# Patient Record
Sex: Female | Born: 1960 | Race: White | Hispanic: No | Marital: Single | Smoking: Former smoker
Health system: Southern US, Community
[De-identification: ages and names within clinical notes are randomized; demographics above are authoritative.]

## PROBLEM LIST (undated history)

## (undated) DIAGNOSIS — M810 Age-related osteoporosis without current pathological fracture: Secondary | ICD-10-CM

## (undated) DIAGNOSIS — E039 Hypothyroidism, unspecified: Secondary | ICD-10-CM

## (undated) DIAGNOSIS — K219 Gastro-esophageal reflux disease without esophagitis: Secondary | ICD-10-CM

## (undated) DIAGNOSIS — M1990 Unspecified osteoarthritis, unspecified site: Secondary | ICD-10-CM

## (undated) DIAGNOSIS — A1801 Tuberculosis of spine: Secondary | ICD-10-CM

## (undated) DIAGNOSIS — N183 Chronic kidney disease, stage 3 unspecified: Secondary | ICD-10-CM

## (undated) DIAGNOSIS — E271 Primary adrenocortical insufficiency: Secondary | ICD-10-CM

## (undated) HISTORY — DX: Tuberculosis of spine: A18.01

## (undated) HISTORY — DX: Unspecified osteoarthritis, unspecified site: M19.90

## (undated) HISTORY — DX: Chronic kidney disease, stage 3 unspecified: N18.30

## (undated) HISTORY — DX: Age-related osteoporosis without current pathological fracture: M81.0

## (undated) HISTORY — DX: Gastro-esophageal reflux disease without esophagitis: K21.9

## (undated) HISTORY — DX: Primary adrenocortical insufficiency: E27.1

## (undated) HISTORY — DX: Hypothyroidism, unspecified: E03.9

## (undated) HISTORY — PX: COLONOSCOPY, ESOPHAGOGASTRODUODENOSCOPY (EGD) AND ESOPHAGEAL DILATION: SHX5781

---
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
## 1987-11-27 HISTORY — PX: TUBAL LIGATION: SHX77

## 1991-11-27 HISTORY — PX: ABDOMINAL HYSTERECTOMY: SHX81

## 1991-11-27 HISTORY — PX: PAROTIDECTOMY: SHX2163

## 1998-11-26 HISTORY — PX: TUMOR EXCISION: SHX421

## 2000-11-26 HISTORY — PX: ADRENALECTOMY: SHX876

## 2001-11-26 HISTORY — PX: CHOLECYSTECTOMY: SHX55

## 2003-11-27 HISTORY — PX: FRACTURE SURGERY: SHX138

## 2010-11-26 HISTORY — PX: PVC ABLATION: EP1236

## 2012-02-21 DIAGNOSIS — G909 Disorder of the autonomic nervous system, unspecified: Secondary | ICD-10-CM

## 2012-02-21 HISTORY — DX: Disorder of the autonomic nervous system, unspecified: G90.9

## 2012-11-07 DIAGNOSIS — E2740 Unspecified adrenocortical insufficiency: Secondary | ICD-10-CM | POA: Insufficient documentation

## 2012-11-07 HISTORY — DX: Unspecified adrenocortical insufficiency: E27.40

## 2017-12-09 DIAGNOSIS — M5030 Other cervical disc degeneration, unspecified cervical region: Secondary | ICD-10-CM | POA: Insufficient documentation

## 2017-12-09 HISTORY — DX: Other cervical disc degeneration, unspecified cervical region: M50.30

## 2018-01-02 DIAGNOSIS — R053 Chronic cough: Secondary | ICD-10-CM | POA: Insufficient documentation

## 2018-01-02 HISTORY — DX: Chronic cough: R05.3

## 2018-01-29 DIAGNOSIS — G4701 Insomnia due to medical condition: Secondary | ICD-10-CM | POA: Insufficient documentation

## 2018-01-29 DIAGNOSIS — F419 Anxiety disorder, unspecified: Secondary | ICD-10-CM | POA: Insufficient documentation

## 2018-01-29 HISTORY — DX: Anxiety disorder, unspecified: F41.9

## 2018-06-11 DIAGNOSIS — S0502XA Injury of conjunctiva and corneal abrasion without foreign body, left eye, initial encounter: Secondary | ICD-10-CM | POA: Insufficient documentation

## 2018-06-11 HISTORY — DX: Injury of conjunctiva and corneal abrasion without foreign body, left eye, initial encounter: S05.02XA

## 2021-03-28 DIAGNOSIS — Z13220 Encounter for screening for lipoid disorders: Secondary | ICD-10-CM | POA: Diagnosis not present

## 2021-03-28 DIAGNOSIS — Z79899 Other long term (current) drug therapy: Secondary | ICD-10-CM | POA: Diagnosis not present

## 2021-03-28 DIAGNOSIS — B372 Candidiasis of skin and nail: Secondary | ICD-10-CM | POA: Diagnosis not present

## 2021-03-28 DIAGNOSIS — E039 Hypothyroidism, unspecified: Secondary | ICD-10-CM | POA: Diagnosis not present

## 2021-04-21 DIAGNOSIS — E860 Dehydration: Secondary | ICD-10-CM | POA: Diagnosis not present

## 2021-04-21 DIAGNOSIS — E039 Hypothyroidism, unspecified: Secondary | ICD-10-CM | POA: Diagnosis not present

## 2021-04-21 DIAGNOSIS — R079 Chest pain, unspecified: Secondary | ICD-10-CM | POA: Diagnosis not present

## 2021-04-21 DIAGNOSIS — K219 Gastro-esophageal reflux disease without esophagitis: Secondary | ICD-10-CM | POA: Diagnosis not present

## 2021-04-21 DIAGNOSIS — R42 Dizziness and giddiness: Secondary | ICD-10-CM | POA: Diagnosis not present

## 2021-04-26 DIAGNOSIS — I498 Other specified cardiac arrhythmias: Secondary | ICD-10-CM | POA: Diagnosis not present

## 2021-04-26 DIAGNOSIS — R0789 Other chest pain: Secondary | ICD-10-CM | POA: Diagnosis not present

## 2021-04-26 DIAGNOSIS — Z6824 Body mass index (BMI) 24.0-24.9, adult: Secondary | ICD-10-CM | POA: Diagnosis not present

## 2021-05-02 ENCOUNTER — Encounter: Payer: Self-pay | Admitting: Cardiology

## 2021-05-31 NOTE — Progress Notes (Signed)
Cardiology Office Note:    Date:  06/01/2021   ID:  Denise Foley, DOB 1961-01-12, MRN [id number]  PCP:  Antionette Fairy, PA-C  Cardiologist:  Shirlee More, MD   Referring MD: Antionette Fairy, PA-C  ASSESSMENT:    1. Chest pain of uncertain etiology   2. PVC's (premature ventricular contractions)   3. Orthostatic hypotension   4. Stage 3b chronic kidney disease (HCC)    PLAN:    In order of problems listed above:  I would describe her chest pain best is nonanginal however she has evidence of vascular disease and carotid duplex has had complex cardiac disease with frequent PVCs ablation has chronic kidney disease and dysautonomia.  We discussed modalities for ischemia evaluation after discussion of benefits and options she will undergo cardiac CTA.  If high risk markers would need to undergo coronary angiography and revascularization.  I Minna start her on a low-dose of a low intensity statin with her cerebrovascular disease. Stable no clinical recurrence after PVC ablation Manageable she is learning to adjust her lifestyle I asked her to consider using abdominal binder that can be remarkably effective and a gastric pressor reflux drinking 16 ounces of water in the morning before getting out of bed that helps with severe early morning symptoms.  Unfortunately is intolerant of Florinef and midodrine with her CKD.  If she needs a higher level of care [facility] has a multidisciplinary dysautonomia and syncope program that can be remarkably effective and has resources including psychology nutrition exercise physiology endocrinology neurology cardiology and array of midlevel providers to be available to provide constant feedback and instruction to patient's. Recheck carotid duplex to resolve the discrepancy between angiography normal and duplex with bilateral mild stenosis Recheck echocardiogram with mild aortic and mitral regurgitation.  Next appointment 4 weeks   Medication Adjustments/Labs and  Tests Ordered: Current medicines are reviewed at length with the patient today.  Concerns regarding medicines are outlined above.  Orders Placed This Encounter  Procedures   CT CORONARY MORPH W/CTA COR W/SCORE W/CA W/CM &/OR WO/CM   Basic metabolic panel   ECHOCARDIOGRAM COMPLETE   VAS US CAROTID    Meds ordered this encounter  Medications   pravastatin (PRAVACHOL) 20 MG tablet    Sig: Take 1 tablet (20 mg total) by mouth every evening.    Dispense:  90 tablet    Refill:  3   metoprolol tartrate (LOPRESSOR) 100 MG tablet    Sig: Take 1 tablet (100 mg total) by mouth once for 1 dose. Take two hours prior to your cardiac CT    Dispense:  1 tablet    Refill:  0      Chief complaint and need to establish cardiology care, I was seen in the emergency room with chest pain.  History of Present Illness:    Denise Foley is a 60 y.o. female who is being seen today for the evaluation of POTS who at the request of Baucom, Lennon Alstrom, PA-C.  There is notation by neurology [facility] that she has dysautonomia associated with syncope.   Chart review shows a history of adrenal insufficiency followed by [name] endocrinology along with hypothyroidism and osteoporosis.  There is a notation that she has PVCs and EP ablation 2012 as well as orthostatic hypotension and previous underwent tilt table testing which was abnormal Florinef was not tolerated due to hypotension and midodrine was associated with worsened renal function.  Her EKG at that time was described  as sinus rhythm normal with no PVCs  She was seen by primary care physician 04/26/2021 with complaints of palpitation shortness of breath and chest pain and  seen at [facility] ED 04/21/2021 for the same complaints.  EKG 04/21/2021 independently reviewed sinus rhythm nonspecific ST abnormality otherwise normal  ED record 04/21/2021 related history of chest pain for several days dizziness weakness fatigue and nausea.  Heart  rate was 85 bpm blood pressure 132/79  CBC normal hemoglobin 13.5 BMP with a creatinine 1.60 GFR 33 cc stage III CKD troponin undetectable proBNP level very low at 51 D-dimer low at 291 Free T4 to free T3 were normal with a low TSH.  CTA of chest showed no evidence of pulmonary embolism or acute abnormality the aorta and cardiovascular structures are normal and there is no coronary artery calcification seen.  She was discharged in ED with instructions to follow-up with her primary care physician.  He has very good healthcare knowledge and literacy unfortunately brought a large volume of records with her as I could not see the cardiology records within epic. She been taking care of by a dysautonomia specialist in [place]. Overall she is functioning well she has a small amount of salt to her diet stays well-hydrated and avoids prolonged standing and has had no syncopal episodes. She had a very high frequency of symptomatic PVCs and underwent EP ablation 2013 for RV outflow track focus successfully no recurrence. She had a normal cerebral angiogram in 2009. Subsequently she had a carotid duplex in 2013 showing 1 to 49% bilateral ICA stenosis and plaque in the bulb bilaterally. She underwent a myocardial perfusion study 2013 normal She had an echocardiogram 2012 which showed aortic valve sclerosis mild aortic mild mitral and mild tricuspid regurgitation. She has no known history of congenital or rheumatic heart disease.  She went to the emergency room at [facility] because of pressure she frequently gets a sensation of blood rushing in her arms or legs this was different she had pressure in the left chest extending into the arms and lasted for about a day wax and wane seen in the ED felt not to have acute coronary syndrome and discharged its not recurred she has 1 or 2 episodes a month like this but this episode was more severe more sustained.  It is unrelated to activity on relieved with rest not pleuritic  no diaphoresis or shortness of breath and no associated GI symptoms.  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 Take with a full glass of water on an empty stomach.   ALPRAZolam (XANAX) 0.25 MG tablet Take 0.25 mg by mouth at bedtime as needed for anxiety.   aspirin EC 81  MG tablet Take 81 mg by mouth daily. Swallow whole.   Calcium Carbonate-Vitamin D ([employer] CALCIUM/VIT D SOFT CHEWS PO) Take 2 tablets by mouth daily.   clotrimazole-betamethasone (LOTRISONE) cream Apply 1 application topically as needed (fungus).   cyclobenzaprine (FLEXERIL) 10 MG tablet Take 10 mg by mouth at bedtime.   FLUDROCORTISONE ACETATE PO Take 0.1 mg by mouth daily. Take 1/2 tablet daily   hydrocortisone (CORTEF) 5 MG tablet Take 5 mg by mouth 2 (two) times daily.   lansoprazole (PREVACID) 15 MG capsule Take 15 mg by mouth daily at 12 noon.   levothyroxine (SYNTHROID) 88 MCG tablet Take 88 mcg by mouth daily before breakfast.   liothyronine (CYTOMEL) 5 MCG tablet Take 5 mcg by mouth daily.   meclizine (ANTIVERT) 12.5 MG tablet Take 12.5 mg by mouth 3 (three) times daily as needed for dizziness.   Methylcobalamin (B12-ACTIVE) 1 MG CHEW Chew 1 m by mouth daily.   metoprolol tartrate (LOPRESSOR) 100 MG tablet Take 1 tablet (100 mg total) by mouth once for 1  dose. Take two hours prior to your cardiac CT   Multiple Vitamin (MULTIVITAMIN) tablet Take 1 tablet by mouth daily.   nystatin cream (MYCOSTATIN) Apply 1 application topically 2 (two) times daily.   ondansetron (ZOFRAN) 4 MG tablet Take 4 mg by mouth every 8 (eight) hours as needed for nausea or vomiting.   pravastatin (PRAVACHOL) 20 MG tablet Take 1 tablet (20 mg total) by mouth every evening.   Vitamin D, Ergocalciferol, (DRISDOL) 1.25 MG (50000 UNIT) CAPS capsule Take 50,000 Units by mouth every 7 (seven) days.     Allergies:   Cymbalta [duloxetine hcl], Depakote [divalproex sodium], Lamictal [lamotrigine], Lyrica [pregabalin], Sumatriptan, Tegretol [carbamazepine], Toprol xl [metoprolol], Trazodone and nefazodone, Wellbutrin [bupropion], Zonisamide, and Tape   Social History   Socioeconomic History   Marital status: Single    Spouse name: Not on file   Number of children: Not on file   Years of education: Not on file   Highest education level: Not on file  Occupational History   Not on file  Tobacco Use   Smoking status: Former    Pack years: 0.00    Types: Cigarettes   Smokeless tobacco: Never  Substance and Sexual Activity   Alcohol use: Never   Drug use: Never   Sexual activity: Not on file  Other Topics Concern   Not on file  Social History Narrative   Not on file   Social Determinants of Health   Financial Resource Strain: Not on file  Food Insecurity: Not on file  Transportation Needs: Not on file  Physical Activity: Not on file  Stress: Not on file  Social Connections: Not on file     Family History: The patient's family history includes Congestive Heart Failure in her father; Liver cancer in her mother; Lung cancer in her mother; Stroke in her maternal grandmother; Thyroid disease in her mother.  ROS:   ROS Please see the history of present illness.     All other systems reviewed and are negative.  EKGs/Labs/Other Studies Reviewed:    The following  studies were reviewed today:     Physical Exam:    VS:  BP (!) 157/88 (BP Location: Right Arm, Patient Position: Sitting, Cuff Size: Normal)   Pulse 67   Ht 5\' 4"  (1.626 m)   Wt 146 lb (66.2 kg)   SpO2 99%   BMI 25.06 kg/m     Wt Readings from Last 3  Encounters:  06/01/21 146 lb (66.2 kg)  04/26/21 144 lb (65.3 kg)     GEN:  Well nourished, well developed in no acute distress HEENT: Normal NECK: No JVD; No carotid bruits LYMPHATICS: No lymphadenopathy CARDIAC: RRR, no murmurs, rubs, gallops RESPIRATORY:  Clear to auscultation without rales, wheezing or rhonchi  ABDOMEN: Soft, non-tender, non-distended MUSCULOSKELETAL:  No edema; No deformity  SKIN: Warm and dry NEUROLOGIC:  Alert and oriented x 3 PSYCHIATRIC:  Normal affect     Signed, Shirlee More, MD  06/01/2021 12:11 PM    [place]

## 2021-06-01 ENCOUNTER — Other Ambulatory Visit: Payer: Self-pay

## 2021-06-01 ENCOUNTER — Ambulatory Visit: Payer: Medicare Other | Admitting: Cardiology

## 2021-06-01 ENCOUNTER — Encounter: Payer: Self-pay | Admitting: Cardiology

## 2021-06-01 VITALS — BP 157/88 | HR 67 | Ht 64.0 in | Wt 146.0 lb

## 2021-06-01 DIAGNOSIS — I493 Ventricular premature depolarization: Secondary | ICD-10-CM

## 2021-06-01 DIAGNOSIS — I951 Orthostatic hypotension: Secondary | ICD-10-CM | POA: Diagnosis not present

## 2021-06-01 DIAGNOSIS — R079 Chest pain, unspecified: Secondary | ICD-10-CM

## 2021-06-01 DIAGNOSIS — N1832 Chronic kidney disease, stage 3b: Secondary | ICD-10-CM | POA: Diagnosis not present

## 2021-06-01 MED ORDER — PRAVASTATIN SODIUM 20 MG PO TABS: 20.0000 mg | ORAL_TABLET | Freq: Every evening | ORAL | 3 refills | Status: DC

## 2021-06-01 MED ORDER — METOPROLOL TARTRATE 100 MG PO TABS: 100.0000 mg | ORAL_TABLET | Freq: Once | ORAL | 0 refills | Status: DC

## 2021-06-01 NOTE — Patient Instructions (Signed)
Medication Instructions:  Your physician has recommended you make the following change in your medication:  START: Pravastatin 20 mg take one tablet by mouth daily.  *If you need a refill on your cardiac medications before your next appointment, please call your pharmacy*   Lab Work: Your physician recommends that you return for lab work in: Within one week of your cardiac CT BMP If you have labs (blood work) drawn today and your tests are completely normal, you will receive your results only by: [place] (if you have MyChart) OR A paper copy in the mail If you have any lab test that is abnormal or we need to change your treatment, we will call you to review the results.   Testing/Procedures: Your physician has requested that you have an echocardiogram. Echocardiography is a painless test that uses sound waves to create images of your heart. It provides your doctor with information about the size and shape of your heart and how well your heart's chambers and valves are working. This procedure takes approximately one hour. There are no restrictions for this procedure.  Your physician has requested that you have a carotid duplex. This test is an ultrasound of the carotid arteries in your neck. It looks at blood flow through these arteries that supply the brain with blood. Allow one hour for this exam. There are no restrictions or special instructions.     Your cardiac CT will be scheduled at the below location:   [facility] [address] [place], [place] [address] [phone]   If scheduled at [facility], please arrive at the [facility] main entrance (entrance A) of [facility] 30 minutes prior to test start time. Proceed to the [facility] Radiology Department (first floor) to check-in and test prep.   Please follow these instructions carefully (unless otherwise directed):  On the Night Before the Test: Be sure to Drink plenty of water. Do  not consume any caffeinated/decaffeinated beverages or chocolate 12 hours prior to your test. Do not take any antihistamines 12 hours prior to your test.  On the Day of the Test: Drink plenty of water until 1 hour prior to the test. Do not eat any food 4 hours prior to the test. You may take your regular medications prior to the test.  Take metoprolol (Lopressor) two hours prior to test. FEMALES- please wear underwire-free bra if available      After the Test: Drink plenty of water. After receiving IV contrast, you may experience a mild flushed feeling. This is normal. On occasion, you may experience a mild rash up to 24 hours after the test. This is not dangerous. If this occurs, you can take Benadryl 25 mg and increase your fluid intake. If you experience trouble breathing, this can be serious. If it is severe call 911 IMMEDIATELY. If it is mild, please call our office. If you take any of these medications: Glipizide/Metformin, Avandament, Glucavance, please do not take 48 hours after completing test unless otherwise instructed.   Once we have confirmed authorization from your insurance company, we will call you to set up a date and time for your test. Based on how quickly your insurance processes prior authorizations requests, please allow up to 4 weeks to be contacted for scheduling your Cardiac CT appointment. Be advised that routine Cardiac CT appointments could be scheduled as many as 8 weeks after your provider has ordered it.  For non-scheduling related questions, please contact the cardiac imaging  nurse navigator should you have any questions/concerns: Marchia Bond, Cardiac Imaging Nurse Navigator Gordy Clement, Cardiac Imaging Nurse Navigator [facility] Direct Office Dial: [phone]   For scheduling needs, including cancellations and rescheduling, please call [place], [phone].    Follow-Up: At [facility], you and your health needs  are our priority.  As part of our continuing mission to provide you with exceptional heart care, we have created designated Provider Care Teams.  These Care Teams include your primary Cardiologist (physician) and Advanced Practice Providers (APPs -  Physician Assistants and Nurse Practitioners) who all work together to provide you with the care you need, when you need it.  We recommend signing up for the patient portal called "MyChart".  Sign up information is provided on this After Visit Summary.  MyChart is used to connect with patients for Virtual Visits (Telemedicine).  Patients are able to view lab/test results, encounter notes, upcoming appointments, etc.  Non-urgent messages can be sent to your provider as well.   To learn more about what you can do with MyChart, go to [email or web address].    Your next appointment:   6 week(s)  The format for your next appointment:   In Person  Provider:   Shirlee More, MD   Other Instructions

## 2021-06-06 ENCOUNTER — Other Ambulatory Visit: Payer: Self-pay

## 2021-06-06 DIAGNOSIS — I493 Ventricular premature depolarization: Secondary | ICD-10-CM | POA: Diagnosis not present

## 2021-06-06 DIAGNOSIS — R079 Chest pain, unspecified: Secondary | ICD-10-CM | POA: Diagnosis not present

## 2021-06-06 DIAGNOSIS — I951 Orthostatic hypotension: Secondary | ICD-10-CM

## 2021-06-06 DIAGNOSIS — N1832 Chronic kidney disease, stage 3b: Secondary | ICD-10-CM | POA: Diagnosis not present

## 2021-06-07 LAB — BASIC METABOLIC PANEL
BUN/Creatinine Ratio: 16 (ref 9–23)
BUN: 21 mg/dL (ref 6–24)
CO2: 20 mmol/L (ref 20–29)
Calcium: 9.4 mg/dL (ref 8.7–10.2)
Chloride: 101 mmol/L (ref 96–106)
Creatinine, Ser: 1.35 mg/dL — ABNORMAL HIGH (ref 0.57–1.00)
Glucose: 87 mg/dL (ref 65–99)
Potassium: 4.4 mmol/L (ref 3.5–5.2)
Sodium: 137 mmol/L (ref 134–144)
eGFR: 45 mL/min/{1.73_m2} — ABNORMAL LOW (ref >59)

## 2021-06-08 ENCOUNTER — Telehealth (HOSPITAL_COMMUNITY): Payer: Self-pay | Admitting: Emergency Medicine

## 2021-06-08 NOTE — Telephone Encounter (Signed)
Reaching out to patient to offer assistance regarding upcoming cardiac imaging study; pt verbalizes understanding of appt date/time, parking situation and where to check in, pre-test NPO status and medications ordered, and verified current allergies; name and call back number provided for further questions should they arise Marchia Bond RN Navigator Cardiac Imaging [facility] [phone] office [phone] cell  Denies claustro Reports scar tissue to arms/iv sites  100mg  metoprolol tartrate 2 hr prior to scan

## 2021-06-12 ENCOUNTER — Ambulatory Visit (HOSPITAL_COMMUNITY)
Admission: RE | Admit: 2021-06-12 | Discharge: 2021-06-12 | Disposition: A | Discharge status: Home / Self Care | Payer: Medicare Other | Source: Ambulatory Visit | Attending: Cardiology | Admitting: Cardiology

## 2021-06-12 ENCOUNTER — Other Ambulatory Visit: Payer: Self-pay

## 2021-06-12 ENCOUNTER — Encounter (HOSPITAL_COMMUNITY): Payer: Self-pay

## 2021-06-12 DIAGNOSIS — I2510 Atherosclerotic heart disease of native coronary artery without angina pectoris: Secondary | ICD-10-CM | POA: Insufficient documentation

## 2021-06-12 DIAGNOSIS — N1832 Chronic kidney disease, stage 3b: Secondary | ICD-10-CM | POA: Insufficient documentation

## 2021-06-12 DIAGNOSIS — I493 Ventricular premature depolarization: Secondary | ICD-10-CM | POA: Insufficient documentation

## 2021-06-12 DIAGNOSIS — I951 Orthostatic hypotension: Secondary | ICD-10-CM | POA: Diagnosis not present

## 2021-06-12 DIAGNOSIS — R079 Chest pain, unspecified: Secondary | ICD-10-CM | POA: Insufficient documentation

## 2021-06-12 MED ORDER — ONDANSETRON HCL 4 MG/2ML IJ SOLN: 4.0000 mg | Freq: Once | INTRAMUSCULAR | Status: AC

## 2021-06-12 MED ORDER — NITROGLYCERIN 0.4 MG SL SUBL
SUBLINGUAL_TABLET | SUBLINGUAL | Status: AC
  Administered 2021-06-12: 0.4 mg via SUBLINGUAL
  Filled 2021-06-12: qty 2

## 2021-06-12 MED ORDER — ONDANSETRON HCL 4 MG/2ML IJ SOLN
INTRAMUSCULAR | Status: AC
  Administered 2021-06-12: 4 mg via INTRAVENOUS
  Filled 2021-06-12: qty 2

## 2021-06-12 MED ORDER — IOHEXOL 350 MG/ML SOLN
80.0000 mL | Freq: Once | INTRAVENOUS | Status: AC | PRN
  Administered 2021-06-12: 80 mL via INTRAVENOUS

## 2021-06-12 MED ORDER — NITROGLYCERIN 0.4 MG SL SUBL: 0.4000 mg | SUBLINGUAL_TABLET | Freq: Once | SUBLINGUAL | Status: AC

## 2021-06-12 NOTE — Progress Notes (Signed)
Spoke with Dr. Gardiner Rhyme patient states she gets a migraine last time taking Nitro and nauseated. Ordered one Nitro 0.4mg  SL and Zofran 4mg  IVP. Patient tolerated CT well.  Vital signs stable encourage to drink water throughout day.Reasons explained and verbalized understanding.

## 2021-06-19 ENCOUNTER — Ambulatory Visit (INDEPENDENT_AMBULATORY_CARE_PROVIDER_SITE_OTHER): Payer: Medicare Other

## 2021-06-19 ENCOUNTER — Other Ambulatory Visit: Payer: Self-pay

## 2021-06-19 DIAGNOSIS — I493 Ventricular premature depolarization: Secondary | ICD-10-CM

## 2021-06-19 DIAGNOSIS — R079 Chest pain, unspecified: Secondary | ICD-10-CM | POA: Diagnosis not present

## 2021-06-19 DIAGNOSIS — I6522 Occlusion and stenosis of left carotid artery: Secondary | ICD-10-CM | POA: Diagnosis not present

## 2021-06-19 DIAGNOSIS — I951 Orthostatic hypotension: Secondary | ICD-10-CM | POA: Diagnosis not present

## 2021-06-19 DIAGNOSIS — N1832 Chronic kidney disease, stage 3b: Secondary | ICD-10-CM

## 2021-06-19 LAB — ECHOCARDIOGRAM COMPLETE
Area-P 1/2: 3.5 cm2
S' Lateral: 2.4 cm

## 2021-06-19 NOTE — Progress Notes (Signed)
Complete echocardiogram performed.  Jimmy Stewart Pimenta RDCS, RVT  

## 2021-06-19 NOTE — Progress Notes (Signed)
Carotid duplex exam has been performed.  Jimmy Alfred Harrel RDCS, RVT 

## 2021-06-28 DIAGNOSIS — F419 Anxiety disorder, unspecified: Secondary | ICD-10-CM | POA: Diagnosis not present

## 2021-06-28 DIAGNOSIS — E785 Hyperlipidemia, unspecified: Secondary | ICD-10-CM | POA: Diagnosis not present

## 2021-06-28 DIAGNOSIS — Z79899 Other long term (current) drug therapy: Secondary | ICD-10-CM | POA: Diagnosis not present

## 2021-06-28 DIAGNOSIS — E039 Hypothyroidism, unspecified: Secondary | ICD-10-CM | POA: Diagnosis not present

## 2021-07-17 DIAGNOSIS — K219 Gastro-esophageal reflux disease without esophagitis: Secondary | ICD-10-CM | POA: Insufficient documentation

## 2021-07-17 DIAGNOSIS — N183 Chronic kidney disease, stage 3 unspecified: Secondary | ICD-10-CM | POA: Insufficient documentation

## 2021-07-17 DIAGNOSIS — M810 Age-related osteoporosis without current pathological fracture: Secondary | ICD-10-CM | POA: Insufficient documentation

## 2021-07-17 DIAGNOSIS — A1801 Tuberculosis of spine: Secondary | ICD-10-CM | POA: Insufficient documentation

## 2021-07-17 DIAGNOSIS — M1990 Unspecified osteoarthritis, unspecified site: Secondary | ICD-10-CM | POA: Insufficient documentation

## 2021-07-17 DIAGNOSIS — E271 Primary adrenocortical insufficiency: Secondary | ICD-10-CM | POA: Insufficient documentation

## 2021-07-17 DIAGNOSIS — E039 Hypothyroidism, unspecified: Secondary | ICD-10-CM | POA: Insufficient documentation

## 2021-07-18 ENCOUNTER — Encounter: Payer: Self-pay | Admitting: Cardiology

## 2021-07-18 ENCOUNTER — Ambulatory Visit: Payer: Medicare Other | Admitting: Cardiology

## 2021-07-18 ENCOUNTER — Other Ambulatory Visit: Payer: Self-pay

## 2021-07-18 VITALS — BP 105/73 | HR 74 | Ht 64.0 in | Wt 145.8 lb

## 2021-07-18 DIAGNOSIS — I6523 Occlusion and stenosis of bilateral carotid arteries: Secondary | ICD-10-CM

## 2021-07-18 DIAGNOSIS — I493 Ventricular premature depolarization: Secondary | ICD-10-CM | POA: Diagnosis not present

## 2021-07-18 DIAGNOSIS — I25118 Atherosclerotic heart disease of native coronary artery with other forms of angina pectoris: Secondary | ICD-10-CM

## 2021-07-18 DIAGNOSIS — I951 Orthostatic hypotension: Secondary | ICD-10-CM | POA: Diagnosis not present

## 2021-07-18 DIAGNOSIS — E271 Primary adrenocortical insufficiency: Secondary | ICD-10-CM | POA: Diagnosis not present

## 2021-07-18 DIAGNOSIS — N1832 Chronic kidney disease, stage 3b: Secondary | ICD-10-CM

## 2021-07-18 DIAGNOSIS — R079 Chest pain, unspecified: Secondary | ICD-10-CM | POA: Diagnosis not present

## 2021-07-18 MED ORDER — PRAVASTATIN SODIUM 20 MG PO TABS: 20.0000 mg | ORAL_TABLET | Freq: Every evening | ORAL | 3 refills | Status: DC

## 2021-07-18 NOTE — Progress Notes (Signed)
Cardiology Office Note:    Date:  07/18/2021   ID:  Denise Foley, DOB 1961/04/01, MRN [id number]  PCP:  Denise Fairy, PA-C  Cardiologist:  Denise More, MD    Referring MD: Denise Fairy, PA-C    ASSESSMENT:    1. Coronary artery disease of native artery of native heart with stable angina pectoris ([place])   2. Bilateral carotid artery stenosis   3. Orthostatic hypotension   4. PVC's (premature ventricular contractions)   5. Stage 3b chronic kidney disease ([place])   6. Addison's disease ([place])    PLAN:    In order of problems listed above:  Although her coronary calcium score is 0 she has mild plaque in the right coronary artery in her case would benefit from lipid-lowering treatment to slow disease progression along with her carotid atherosclerosis.  She tolerates pravastatin check lipid profile today.  Goal LDL less than 70. She is doing well with orthostatic hypotension no severe symptoms recently Stable PVCs Recheck renal function Continue her adrenal supplements   Next appointment: 9 months   Medication Adjustments/Labs and Tests Ordered: Current medicines are reviewed at length with the patient today.  Concerns regarding medicines are outlined above.  Orders Placed This Encounter  Procedures   Comprehensive metabolic panel   Lipid panel   Meds ordered this encounter  Medications   pravastatin (PRAVACHOL) 20 MG tablet    Sig: Take 1 tablet (20 mg total) by mouth every evening.    Dispense:  90 tablet    Refill:  3   Chief complaint follow-up after multiple cardiac test   History of Present Illness:    Denise Foley is a 60 y.o. female with a hx of dysautonomia and syncope last seen 06/01/2001 to establish cardiology care.Chart review shows a history of adrenal insufficiency followed by Dr. Posey Foley endocrinology along with hypothyroidism and osteoporosis.  There is a notation that she has PVCs and EP ablation 2012 as well as orthostatic hypotension and previous  underwent tilt table testing which was abnormal Florinef was not tolerated due to hypotension and midodrine was associated with worsened renal function.  Her EKG at that time was described as sinus rhythm normal with no PVCs . Compliance with diet, lifestyle and medications: Yes  I reviewed her testing with her although her coronary calcium score is 0 she has noncalcified plaque with mild stenosis right coronary artery not hemodynamically significant.  Her carotid duplex shows mild to moderate stenosis and echocardiogram structurally normal mitral valve with mild regurgitation.  Echocardiogram performed 06/19/2021 showed normal left ventricular size wall thickness systolic and diastolic function.  The right ventricle had normal size function and pulmonary artery pressure.  There is mild mitral regurgitation.  Cerebrovascular duplex showed 1 to 39% right internal carotid artery stenosis and 40 to 59% left ICA stenosis by velocity an echocardiogram shows a structurally normal mitral valve with no significant regurgitation.  Cardiac CTA showed a calcium score of 0 with nonobstructive CAD in the proximal right coronary artery 25 to 49% stenosis. 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 Take with a full glass of water on an empty stomach.    ALPRAZolam (XANAX) 0.25 MG tablet Take 0.25 mg by mouth at bedtime as needed for anxiety.   aspirin EC 81 MG tablet Take 81 mg by mouth daily. Swallow whole.   Calcium Carbonate-Vitamin D ([employer]S CALCIUM/VIT D SOFT CHEWS PO) Take 2 tablets by mouth daily.   clotrimazole-betamethasone (LOTRISONE) cream Apply 1 application topically as needed (fungus).   cyclobenzaprine (FLEXERIL) 10 MG tablet Take 10 mg by mouth at bedtime.   FLUDROCORTISONE ACETATE PO Take 0.1 mg by mouth daily. Take 1/2 tablet daily   hydrocortisone (CORTEF) 5 MG tablet Take 5 mg by mouth 2 (two) times daily.   lansoprazole (PREVACID) 15 MG capsule Take 15 mg by mouth daily at 12 noon.   levothyroxine (SYNTHROID) 88 MCG tablet Take 88 mcg by mouth daily before breakfast.   liothyronine (CYTOMEL) 5 MCG tablet Take 5 mcg by mouth daily.   meclizine (ANTIVERT) 12.5 MG tablet Take 12.5 mg by mouth 3 (three) times daily as needed for dizziness.   Methylcobalamin (B12-ACTIVE) 1 MG CHEW Chew 1 m by mouth daily.   Multiple Vitamin (MULTIVITAMIN) tablet Take 1 tablet by mouth daily.   nystatin cream (MYCOSTATIN) Apply 1 application topically 2 (two) times daily.   ondansetron (ZOFRAN) 4 MG tablet Take 4 mg by mouth every 8 (eight) hours as needed for nausea or vomiting.   Vitamin D, Ergocalciferol, (DRISDOL) 1.25 MG (50000 UNIT) CAPS capsule Take 50,000 Units by mouth every 7 (seven) days.   [DISCONTINUED] pravastatin (PRAVACHOL) 20 MG tablet Take 1 tablet (20 mg total) by mouth every evening.     Allergies:   Cymbalta [duloxetine hcl], Depakote [divalproex sodium], Lamictal [lamotrigine], Lyrica [pregabalin], Sumatriptan, Tegretol [carbamazepine], Toprol xl [metoprolol], Trazodone and nefazodone, Wellbutrin [bupropion], Zonisamide, and Tape   Social History   Socioeconomic History   Marital status: Single    Spouse name: Not on file   Number of children: Not on file   Years of education: Not on file   Highest education level:  Not on file  Occupational History   Not on file  Tobacco Use   Smoking status: Former    Types: Cigarettes   Smokeless tobacco: Never  Substance and Sexual Activity   Alcohol use: Never   Drug use: Never   Sexual activity: Not on file  Other Topics Concern   Not on file  Social History Narrative   Not on file   Social Determinants of Health   Financial Resource Strain: Not on file  Food Insecurity: Not on file  Transportation Needs: Not on file  Physical Activity: Not on file  Stress: Not on file  Social Connections: Not on file     Family History: The patient's family history includes Congestive Heart Failure in her father; Liver cancer in her mother; Lung cancer in her mother; Stroke in her maternal grandmother; Thyroid disease in her mother. ROS:   Please see the history of present illness.    All other systems reviewed and are negative.  EKGs/Labs/Other Studies Reviewed:    The following  studies were reviewed today:    Recent Labs: 06/06/2021: BUN 21; Creatinine, Ser 1.35; Potassium 4.4; Sodium 137  Recent Lipid Panel 03/28/2021: Cholesterol 187 LDL 106 triglycerides 58 HDL 53  Physical Exam:    VS:  BP 105/73 (BP Location: Right Arm, Patient Position: Sitting, Cuff Size: Large)   Pulse 74   Ht '5\' 4"'$  (1.626 m)   Wt 145 lb 12.8 oz (66.1 kg)   SpO2 99%   BMI 25.03 kg/m     Wt Readings from Last 3 Encounters:  07/18/21 145 lb 12.8 oz (66.1 kg)  06/01/21 146 lb (66.2 kg)  04/26/21 144 lb (65.3 kg)     GEN:  Well nourished, well developed in no acute distress HEENT: Normal NECK: No JVD; No carotid bruits LYMPHATICS: No lymphadenopathy CARDIAC: RRR, no murmurs, rubs, gallops RESPIRATORY:  Clear to auscultation without rales, wheezing or rhonchi  ABDOMEN: Soft, non-tender, non-distended MUSCULOSKELETAL:  No edema; No deformity  SKIN: Warm and dry NEUROLOGIC:  Alert and oriented x 3 PSYCHIATRIC:  Normal affect    Signed, Denise More, MD   07/18/2021 4:00 PM    [facility]

## 2021-07-18 NOTE — Patient Instructions (Signed)
Medication Instructions:  .isntcu  *If you need a refill on your cardiac medications before your next appointment, please call your pharmacy*   Lab Work: Your physician recommends that you return for lab work in:  TODAY: CMP, Lipids If you have labs (blood work) drawn today and your tests are completely normal, you will receive your results only by: [place] (if you have [place]) OR A paper copy in the mail If you have any lab test that is abnormal or we need to change your treatment, we will call you to review the results.   Testing/Procedures: None   Follow-Up: At [facility], you and your health needs are our priority.  As part of our continuing mission to provide you with exceptional heart care, we have created designated Provider Care Teams.  These Care Teams include your primary Cardiologist (physician) and Advanced Practice Providers (APPs -  Physician Assistants and Nurse Practitioners) who all work together to provide you with the care you need, when you need it.  We recommend signing up for the patient portal called "MyChart".  Sign up information is provided on this After Visit Summary.  MyChart is used to connect with patients for Virtual Visits (Telemedicine).  Patients are able to view lab/test results, encounter notes, upcoming appointments, etc.  Non-urgent messages can be sent to your provider as well.   To learn more about what you can do with MyChart, go to [email or web address].    Your next appointment:   9 month(s)  The format for your next appointment:   In Person  Provider:   Shirlee More, MD   Other Instructions

## 2021-07-19 LAB — COMPREHENSIVE METABOLIC PANEL
ALT: 20 IU/L (ref 0–32)
AST: 30 IU/L (ref 0–40)
Albumin/Globulin Ratio: 2.2 (ref 1.2–2.2)
Albumin: 4.6 g/dL (ref 3.8–4.9)
Alkaline Phosphatase: 58 IU/L (ref 44–121)
BUN/Creatinine Ratio: 13 (ref 9–23)
BUN: 19 mg/dL (ref 6–24)
Bilirubin Total: 0.2 mg/dL (ref 0.0–1.2)
CO2: 21 mmol/L (ref 20–29)
Calcium: 9.6 mg/dL (ref 8.7–10.2)
Chloride: 101 mmol/L (ref 96–106)
Creatinine, Ser: 1.46 mg/dL — ABNORMAL HIGH (ref 0.57–1.00)
Globulin, Total: 2.1 g/dL (ref 1.5–4.5)
Glucose: 97 mg/dL (ref 65–99)
Potassium: 5.1 mmol/L (ref 3.5–5.2)
Sodium: 138 mmol/L (ref 134–144)
Total Protein: 6.7 g/dL (ref 6.0–8.5)
eGFR: 41 mL/min/{1.73_m2} — ABNORMAL LOW (ref >59)

## 2021-07-19 LAB — LIPID PANEL
Chol/HDL Ratio: 2.6 ratio (ref 0.0–4.4)
Cholesterol, Total: 151 mg/dL (ref 100–199)
HDL: 59 mg/dL (ref >39)
LDL Chol Calc (NIH): 65 mg/dL (ref 0–99)
Triglycerides: 160 mg/dL — ABNORMAL HIGH (ref 0–149)
VLDL Cholesterol Cal: 27 mg/dL (ref 5–40)

## 2021-07-26 DIAGNOSIS — N1832 Chronic kidney disease, stage 3b: Secondary | ICD-10-CM | POA: Diagnosis not present

## 2021-07-28 DIAGNOSIS — N1832 Chronic kidney disease, stage 3b: Secondary | ICD-10-CM | POA: Diagnosis not present

## 2021-08-01 DIAGNOSIS — N1832 Chronic kidney disease, stage 3b: Secondary | ICD-10-CM | POA: Diagnosis not present

## 2021-08-01 DIAGNOSIS — E2740 Unspecified adrenocortical insufficiency: Secondary | ICD-10-CM | POA: Diagnosis not present

## 2021-08-01 DIAGNOSIS — I129 Hypertensive chronic kidney disease with stage 1 through stage 4 chronic kidney disease, or unspecified chronic kidney disease: Secondary | ICD-10-CM | POA: Diagnosis not present

## 2021-09-05 DIAGNOSIS — E559 Vitamin D deficiency, unspecified: Secondary | ICD-10-CM | POA: Diagnosis not present

## 2021-09-05 DIAGNOSIS — M810 Age-related osteoporosis without current pathological fracture: Secondary | ICD-10-CM | POA: Diagnosis not present

## 2021-09-05 DIAGNOSIS — E038 Other specified hypothyroidism: Secondary | ICD-10-CM | POA: Diagnosis not present

## 2021-09-05 DIAGNOSIS — E2740 Unspecified adrenocortical insufficiency: Secondary | ICD-10-CM | POA: Diagnosis not present

## 2021-09-14 DIAGNOSIS — Z1331 Encounter for screening for depression: Secondary | ICD-10-CM | POA: Diagnosis not present

## 2021-09-14 DIAGNOSIS — Z9181 History of falling: Secondary | ICD-10-CM | POA: Diagnosis not present

## 2021-09-14 DIAGNOSIS — Z0000 Encounter for general adult medical examination without abnormal findings: Secondary | ICD-10-CM | POA: Diagnosis not present

## 2021-09-14 DIAGNOSIS — E785 Hyperlipidemia, unspecified: Secondary | ICD-10-CM | POA: Diagnosis not present

## 2021-10-13 DIAGNOSIS — R0789 Other chest pain: Secondary | ICD-10-CM | POA: Diagnosis not present

## 2021-10-13 DIAGNOSIS — R079 Chest pain, unspecified: Secondary | ICD-10-CM | POA: Diagnosis not present

## 2021-10-25 DIAGNOSIS — N1832 Chronic kidney disease, stage 3b: Secondary | ICD-10-CM | POA: Diagnosis not present

## 2021-10-31 DIAGNOSIS — N1832 Chronic kidney disease, stage 3b: Secondary | ICD-10-CM | POA: Diagnosis not present

## 2021-10-31 DIAGNOSIS — E2740 Unspecified adrenocortical insufficiency: Secondary | ICD-10-CM | POA: Diagnosis not present

## 2021-10-31 DIAGNOSIS — E896 Postprocedural adrenocortical (-medullary) hypofunction: Secondary | ICD-10-CM | POA: Diagnosis not present

## 2021-10-31 DIAGNOSIS — I129 Hypertensive chronic kidney disease with stage 1 through stage 4 chronic kidney disease, or unspecified chronic kidney disease: Secondary | ICD-10-CM | POA: Diagnosis not present

## 2021-11-08 DIAGNOSIS — F419 Anxiety disorder, unspecified: Secondary | ICD-10-CM | POA: Diagnosis not present

## 2021-11-08 DIAGNOSIS — E039 Hypothyroidism, unspecified: Secondary | ICD-10-CM | POA: Diagnosis not present

## 2021-11-08 DIAGNOSIS — E785 Hyperlipidemia, unspecified: Secondary | ICD-10-CM | POA: Diagnosis not present

## 2021-11-08 DIAGNOSIS — Z87898 Personal history of other specified conditions: Secondary | ICD-10-CM | POA: Diagnosis not present

## 2021-11-29 DIAGNOSIS — R922 Inconclusive mammogram: Secondary | ICD-10-CM | POA: Diagnosis not present

## 2021-11-29 DIAGNOSIS — R928 Other abnormal and inconclusive findings on diagnostic imaging of breast: Secondary | ICD-10-CM | POA: Diagnosis not present

## 2022-01-07 DIAGNOSIS — Z5321 Procedure and treatment not carried out due to patient leaving prior to being seen by health care provider: Secondary | ICD-10-CM | POA: Diagnosis not present

## 2022-01-07 DIAGNOSIS — R110 Nausea: Secondary | ICD-10-CM | POA: Diagnosis not present

## 2022-01-08 DIAGNOSIS — R1084 Generalized abdominal pain: Secondary | ICD-10-CM | POA: Diagnosis not present

## 2022-01-08 DIAGNOSIS — R109 Unspecified abdominal pain: Secondary | ICD-10-CM | POA: Diagnosis not present

## 2022-01-08 DIAGNOSIS — K5909 Other constipation: Secondary | ICD-10-CM | POA: Diagnosis not present

## 2022-01-08 DIAGNOSIS — K5900 Constipation, unspecified: Secondary | ICD-10-CM | POA: Diagnosis not present

## 2022-01-22 DIAGNOSIS — B349 Viral infection, unspecified: Secondary | ICD-10-CM | POA: Diagnosis not present

## 2022-01-22 DIAGNOSIS — R1110 Vomiting, unspecified: Secondary | ICD-10-CM | POA: Diagnosis not present

## 2022-01-26 DIAGNOSIS — J209 Acute bronchitis, unspecified: Secondary | ICD-10-CM | POA: Diagnosis not present

## 2022-03-06 DIAGNOSIS — E038 Other specified hypothyroidism: Secondary | ICD-10-CM | POA: Diagnosis not present

## 2022-03-06 DIAGNOSIS — Z79890 Hormone replacement therapy (postmenopausal): Secondary | ICD-10-CM | POA: Diagnosis not present

## 2022-03-06 DIAGNOSIS — Z7952 Long term (current) use of systemic steroids: Secondary | ICD-10-CM | POA: Diagnosis not present

## 2022-03-06 DIAGNOSIS — E2740 Unspecified adrenocortical insufficiency: Secondary | ICD-10-CM | POA: Diagnosis not present

## 2022-03-07 DIAGNOSIS — R109 Unspecified abdominal pain: Secondary | ICD-10-CM | POA: Diagnosis not present

## 2022-03-07 DIAGNOSIS — Z6825 Body mass index (BMI) 25.0-25.9, adult: Secondary | ICD-10-CM | POA: Diagnosis not present

## 2022-04-04 DIAGNOSIS — M1990 Unspecified osteoarthritis, unspecified site: Secondary | ICD-10-CM | POA: Diagnosis not present

## 2022-04-04 DIAGNOSIS — Z6825 Body mass index (BMI) 25.0-25.9, adult: Secondary | ICD-10-CM | POA: Diagnosis not present

## 2022-04-04 DIAGNOSIS — G43909 Migraine, unspecified, not intractable, without status migrainosus: Secondary | ICD-10-CM | POA: Diagnosis not present

## 2022-05-04 DIAGNOSIS — E038 Other specified hypothyroidism: Secondary | ICD-10-CM | POA: Diagnosis not present

## 2022-05-04 DIAGNOSIS — N1832 Chronic kidney disease, stage 3b: Secondary | ICD-10-CM | POA: Diagnosis not present

## 2022-05-08 DIAGNOSIS — I129 Hypertensive chronic kidney disease with stage 1 through stage 4 chronic kidney disease, or unspecified chronic kidney disease: Secondary | ICD-10-CM | POA: Diagnosis not present

## 2022-05-08 DIAGNOSIS — N1832 Chronic kidney disease, stage 3b: Secondary | ICD-10-CM | POA: Diagnosis not present

## 2022-05-08 DIAGNOSIS — I951 Orthostatic hypotension: Secondary | ICD-10-CM | POA: Diagnosis not present

## 2022-05-08 DIAGNOSIS — E896 Postprocedural adrenocortical (-medullary) hypofunction: Secondary | ICD-10-CM | POA: Diagnosis not present

## 2022-06-13 DIAGNOSIS — G43909 Migraine, unspecified, not intractable, without status migrainosus: Secondary | ICD-10-CM | POA: Diagnosis not present

## 2022-06-19 ENCOUNTER — Encounter: Payer: Self-pay | Admitting: Neurology

## 2022-06-20 DIAGNOSIS — B3731 Acute candidiasis of vulva and vagina: Secondary | ICD-10-CM | POA: Diagnosis not present

## 2022-06-20 DIAGNOSIS — M25552 Pain in left hip: Secondary | ICD-10-CM | POA: Diagnosis not present

## 2022-06-20 DIAGNOSIS — N390 Urinary tract infection, site not specified: Secondary | ICD-10-CM | POA: Diagnosis not present

## 2022-06-20 DIAGNOSIS — Z043 Encounter for examination and observation following other accident: Secondary | ICD-10-CM | POA: Diagnosis not present

## 2022-06-20 DIAGNOSIS — R1032 Left lower quadrant pain: Secondary | ICD-10-CM | POA: Diagnosis not present

## 2022-06-28 NOTE — Progress Notes (Signed)
Cardiology Office Note:    Date:  06/29/2022   ID:  Denise Foley, DOB 1961-05-21, MRN [id number]  PCP:  Marga Hoots, NP  Cardiologist:  Shirlee More, MD    Referring MD: No ref. provider found    ASSESSMENT:    1. Orthostatic hypotension   2. PVC's (premature ventricular contractions)   3. Addison's disease ([place])   4. Stage 3b chronic kidney disease ([place])   5. Atherosclerosis of native coronary artery of native heart without angina pectoris    PLAN:    In order of problems listed above:  She continues to do well with her adrenal replacement therapy including Florinef place close attention to salt intake adequate fluid intake and is not having severe episodes of hypotension She had EP PVC ablation no severe symptoms or recurrence but is interested in purchasing the mobile Kardia device to record episodes if they happen again in the future Managed by endocrinology on both glucocorticoid and mineralocorticoid replacement Managed by nephrology stable CKD Continue her statin with noncalcified plaque in the coronary artery although her coronary calcium score is 0.    Next appointment: 1 year   Medication Adjustments/Labs and Tests Ordered: Current medicines are reviewed at length with the patient today.  Concerns regarding medicines are outlined above.  No orders of the defined types were placed in this encounter.  No orders of the defined types were placed in this encounter.   Chief Complaint  Patient presents with   Follow-up    PVC's    Hypotension    History of Present Illness:    Denise Foley is a 61 y.o. female with a hx of dysautonomia and syncope last seen 06/01/2001 to establish cardiology care.Chart review shows a history of adrenal insufficiency followed by [name] endocrinology along with hypothyroidism and osteoporosis.  She has had pituitary microadenoma surgery in 2000 for Cushing's disease followed by bilateral adrenalectomy in 2005 as well as  autonomic dysfunction.  There is a notation that she has PVCs and EP ablation 2012 as well as orthostatic hypotension and previous underwent tilt table testing which was abnormal Florinef was not tolerated due to hypotension and midodrine was associated with worsened renal function.  She was last seen 07/18/2022.  She had had a coronary calcium score of 0 but had mild plaque in the right coronary artery and elected lipid-lowering therapy.  Compliance with diet, lifestyle and medications: Yes  Overall she is doing well not much palpitation but she is interested in purchasing the mobile cardia device to trend and record episodes I reviewed with her over-the-counter proarrhythmic drugs and gave her a list of medications to avoid She has mild lightheadedness no severe episodes and takes a small dose of Florinef with minimal lower extremity edema She follows with nephrology and endocrinology for adrenal insufficiency and stage III CKD She has had no shortness of breath chest pain or syncope.  She had a chest x-ray at [facility] 10/13/2021 that was normal.  An EKG was also performed in the emergency room independently reviewed showed sinus rhythm normal EKG  Most recent creatinine 05/04/2022 1.59 with GFR greater then 60 cc.  Her sodium at that time was 136 potassium 4.7. 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 Take with a full glass of water on an empty stomach.   ALPRAZolam (XANAX) 0.25 MG tablet Take 0.25 mg by mouth at bedtime as needed for anxiety.   aspirin EC 81 MG tablet Take 81 mg by mouth daily. Swallow whole.   Calcium Carbonate-Vitamin D (CVS CALCIUM/VIT D SOFT CHEWS PO) Take 2 tablets by mouth daily.   clotrimazole-betamethasone (LOTRISONE) cream Apply 1 application topically as needed (fungus).   cyclobenzaprine (FLEXERIL) 10 MG tablet Take 10 mg by mouth at bedtime.   FLUDROCORTISONE ACETATE PO Take 0.1 mg by mouth daily. Take 1/2 tablet daily   hydrocortisone (CORTEF) 5 MG tablet Take 5 mg by mouth 2 (two) times daily.   lansoprazole (PREVACID) 15 MG capsule Take 15 mg by mouth daily at 12 noon.   levothyroxine (SYNTHROID) 88 MCG tablet Take 88 mcg by mouth daily before breakfast.   liothyronine (CYTOMEL) 5 MCG tablet Take 5 mcg by mouth daily.   meclizine (ANTIVERT) 12.5 MG tablet Take 12.5 mg by mouth 3 (three) times daily as needed for dizziness.   Methylcobalamin (B12-ACTIVE) 1 MG CHEW Chew 1 m by mouth daily.   Multiple Vitamin (MULTIVITAMIN) tablet Take 1 tablet by mouth daily.   nystatin cream (MYCOSTATIN) Apply 1 application topically 2 (two) times daily.   ondansetron (ZOFRAN) 4 MG tablet Take 4 mg by mouth every 8 (eight) hours as needed for nausea or vomiting.   pravastatin (PRAVACHOL) 20 MG tablet Take 1 tablet (20 mg total) by mouth every evening.   Vitamin D, Ergocalciferol, (DRISDOL) 1.25 MG (50000 UNIT) CAPS capsule Take 50,000 Units by mouth every 7 (seven) days.     Allergies:   Cymbalta [duloxetine hcl], Depakote [divalproex sodium], Lamictal [lamotrigine], Lyrica [pregabalin], Sumatriptan, Tegretol [carbamazepine], Toprol xl [metoprolol], Trazodone and nefazodone, Wellbutrin [bupropion], Zonisamide, and Tape   Social History   Socioeconomic History   Marital status: Single     Spouse name: Not on file   Number of children: Not on file   Years of education: Not on file   Highest education level: Not on file  Occupational History   Not on file  Tobacco Use   Smoking status: Former    Types: Cigarettes   Smokeless tobacco: Never  Substance and Sexual Activity   Alcohol use: Never   Drug use: Never   Sexual activity: Not on file  Other Topics Concern   Not on file  Social History Narrative   Not on file   Social Determinants of Health   Financial Resource Strain: Not on file  Food Insecurity: Not on file  Transportation Needs: Not on file  Physical Activity: Not on file  Stress: Not on file  Social Connections: Not on file     Family History: The patient's family history includes Congestive Heart Failure in her father; Liver cancer in her mother; Lung cancer in her mother; Stroke in her maternal grandmother; Thyroid disease in her mother. ROS:  Please see the history of present illness.    All other systems reviewed and are negative.  EKGs/Labs/Other Studies Reviewed:    The following studies were reviewed today:  EKG:  EKG ordered today and personally reviewed.  The ekg ordered today demonstrates sinus rhythm normal EKG normal QT interval and no PVCs  Recent Labs: 07/18/2021: ALT 20; BUN 19; Creatinine, Ser 1.46; Potassium 5.1; Sodium 138  Recent Lipid Panel    Component Value Date/Time   CHOL 151 07/18/2021 1546   TRIG 160 (H) 07/18/2021 1546   HDL 59 07/18/2021 1546   CHOLHDL 2.6 07/18/2021 1546   LDLCALC 65 07/18/2021 1546    Physical Exam:    VS:  BP 122/80 (BP Location: Left Arm, Patient Position: Sitting)   Pulse 86   Ht '5\' 4"'$  (1.626 m)   Wt 144 lb 9.6 oz (65.6 kg)   SpO2 98%   BMI 24.82 kg/m     Wt Readings from Last 3 Encounters:  06/29/22 144 lb 9.6 oz (65.6 kg)  07/18/21 145 lb 12.8 oz (66.1 kg)  06/01/21 146 lb (66.2 kg)     GEN: Appears healthy looks her age well nourished, well developed in no acute  distress HEENT: Normal NECK: No JVD; No carotid bruits LYMPHATICS: No lymphadenopathy CARDIAC: RRR, no murmurs, rubs, gallops RESPIRATORY:  Clear to auscultation without rales, wheezing or rhonchi  ABDOMEN: Soft, non-tender, non-distended MUSCULOSKELETAL:  No edema; No deformity  SKIN: Warm and dry NEUROLOGIC:  Alert and oriented x 3 PSYCHIATRIC:  Normal affect    Signed, Shirlee More, MD  06/29/2022 8:19 AM    [place]

## 2022-06-29 ENCOUNTER — Encounter: Payer: Self-pay | Admitting: Cardiology

## 2022-06-29 ENCOUNTER — Ambulatory Visit: Payer: Medicare Other | Admitting: Cardiology

## 2022-06-29 VITALS — BP 122/80 | HR 86 | Ht 64.0 in | Wt 144.6 lb

## 2022-06-29 DIAGNOSIS — I951 Orthostatic hypotension: Secondary | ICD-10-CM

## 2022-06-29 DIAGNOSIS — T783XXA Angioneurotic edema, initial encounter: Secondary | ICD-10-CM | POA: Insufficient documentation

## 2022-06-29 DIAGNOSIS — N1832 Chronic kidney disease, stage 3b: Secondary | ICD-10-CM | POA: Diagnosis not present

## 2022-06-29 DIAGNOSIS — I493 Ventricular premature depolarization: Secondary | ICD-10-CM | POA: Diagnosis not present

## 2022-06-29 DIAGNOSIS — E271 Primary adrenocortical insufficiency: Secondary | ICD-10-CM

## 2022-06-29 DIAGNOSIS — D563 Thalassemia minor: Secondary | ICD-10-CM

## 2022-06-29 DIAGNOSIS — I2510 Atherosclerotic heart disease of native coronary artery without angina pectoris: Secondary | ICD-10-CM

## 2022-06-29 HISTORY — DX: Thalassemia minor: D56.3

## 2022-06-29 HISTORY — DX: Angioneurotic edema, initial encounter: T78.3XXA

## 2022-06-29 NOTE — Patient Instructions (Signed)
Medication Instructions:  Your physician recommends that you continue on your current medications as directed. Please refer to the Current Medication list given to you today.  *If you need a refill on your cardiac medications before your next appointment, please call your pharmacy*   Lab Work: Your physician recommends that you return for lab work in:   Labs today: CMP, Lipids  If you have labs (blood work) drawn today and your tests are completely normal, you will receive your results only by: [place] (if you have [place]) OR A paper copy in the mail If you have any lab test that is abnormal or we need to change your treatment, we will call you to review the results.   Testing/Procedures: None   Follow-Up: At [facility], you and your health needs are our priority.  As part of our continuing mission to provide you with exceptional heart care, we have created designated Provider Care Teams.  These Care Teams include your primary Cardiologist (physician) and Advanced Practice Providers (APPs -  Physician Assistants and Nurse Practitioners) who all work together to provide you with the care you need, when you need it.  We recommend signing up for the patient portal called "MyChart".  Sign up information is provided on this After Visit Summary.  MyChart is used to connect with patients for Virtual Visits (Telemedicine).  Patients are able to view lab/test results, encounter notes, upcoming appointments, etc.  Non-urgent messages can be sent to your provider as well.   To learn more about what you can do with MyChart, go to [email or web address].    Your next appointment:   1 year(s)  The format for your next appointment:   In Person  Provider:   Shirlee More, MD    Other Instructions None  Important Information About Sugar           1. Avoid all over-the-counter antihistamines except Claritin/Loratadine and Zyrtec/Cetrizine. 2. Avoid all combination  including cold sinus allergies flu decongestant and sleep medications 3. You can use Robitussin DM Mucinex and Mucinex DM for cough. 4. can use Tylenol aspirin ibuprofen and naproxen but no combinations such as sleep or sinus.   [email or web address] [email or web address]        FDA-cleared, clinical grade mobile EKG monitor: Denise Foley is the most clinically-validated mobile EKG used by the world's leading cardiac care medical professionals With Basic service, know instantly if your heart rhythm is normal or if atrial fibrillation is detected, and email the last single EKG recording to yourself or your doctor Premium service, available for purchase through the Kardia app for $9.99 per month or $99 per year, includes unlimited history and storage of your EKG recordings, a monthly EKG summary report to share with your doctor, along with the ability to track your blood pressure, activity and weight Includes one KardiaMobile phone clip FREE SHIPPING: Standard delivery 1-3 business days. Orders placed by 11:00am PST will ship that afternoon. Otherwise, will ship next business day. All orders ship via [employer] from [place], [place]

## 2022-06-29 NOTE — Addendum Note (Signed)
Addended by: Edwyna Shell I on: 06/29/2022 08:32 AM   Modules accepted: Orders

## 2022-06-30 LAB — COMPREHENSIVE METABOLIC PANEL
ALT: 16 IU/L (ref 0–32)
AST: 21 IU/L (ref 0–40)
Albumin/Globulin Ratio: 2.2 (ref 1.2–2.2)
Albumin: 4.9 g/dL (ref 3.8–4.9)
Alkaline Phosphatase: 52 IU/L (ref 44–121)
BUN/Creatinine Ratio: 20 (ref 12–28)
BUN: 33 mg/dL — ABNORMAL HIGH (ref 8–27)
Bilirubin Total: 0.4 mg/dL (ref 0.0–1.2)
CO2: 22 mmol/L (ref 20–29)
Calcium: 9.6 mg/dL (ref 8.7–10.3)
Chloride: 99 mmol/L (ref 96–106)
Creatinine, Ser: 1.66 mg/dL — ABNORMAL HIGH (ref 0.57–1.00)
Globulin, Total: 2.2 g/dL (ref 1.5–4.5)
Glucose: 89 mg/dL (ref 70–99)
Potassium: 4.8 mmol/L (ref 3.5–5.2)
Sodium: 137 mmol/L (ref 134–144)
Total Protein: 7.1 g/dL (ref 6.0–8.5)
eGFR: 35 mL/min/{1.73_m2} — ABNORMAL LOW (ref >59)

## 2022-06-30 LAB — LIPID PANEL
Chol/HDL Ratio: 2.5 ratio (ref 0.0–4.4)
Cholesterol, Total: 157 mg/dL (ref 100–199)
HDL: 63 mg/dL (ref >39)
LDL Chol Calc (NIH): 75 mg/dL (ref 0–99)
Triglycerides: 103 mg/dL (ref 0–149)
VLDL Cholesterol Cal: 19 mg/dL (ref 5–40)

## 2022-07-05 DIAGNOSIS — R109 Unspecified abdominal pain: Secondary | ICD-10-CM | POA: Diagnosis not present

## 2022-07-05 DIAGNOSIS — Z6825 Body mass index (BMI) 25.0-25.9, adult: Secondary | ICD-10-CM | POA: Diagnosis not present

## 2022-08-29 ENCOUNTER — Ambulatory Visit: Payer: Medicare Other | Admitting: Neurology

## 2022-09-03 ENCOUNTER — Other Ambulatory Visit: Payer: Self-pay | Admitting: Cardiology

## 2022-09-03 DIAGNOSIS — I493 Ventricular premature depolarization: Secondary | ICD-10-CM

## 2022-09-03 DIAGNOSIS — N1832 Chronic kidney disease, stage 3b: Secondary | ICD-10-CM

## 2022-09-03 DIAGNOSIS — I951 Orthostatic hypotension: Secondary | ICD-10-CM

## 2022-09-05 DIAGNOSIS — N1832 Chronic kidney disease, stage 3b: Secondary | ICD-10-CM | POA: Diagnosis not present

## 2022-09-11 DIAGNOSIS — N1832 Chronic kidney disease, stage 3b: Secondary | ICD-10-CM | POA: Diagnosis not present

## 2022-09-11 DIAGNOSIS — I129 Hypertensive chronic kidney disease with stage 1 through stage 4 chronic kidney disease, or unspecified chronic kidney disease: Secondary | ICD-10-CM | POA: Diagnosis not present

## 2022-09-11 DIAGNOSIS — E2740 Unspecified adrenocortical insufficiency: Secondary | ICD-10-CM | POA: Diagnosis not present

## 2022-09-27 IMAGING — CT CT HEART MORP W/ CTA COR W/ SCORE W/ CA W/CM &/OR W/O CM
1 series · 6 of 8 positions shown, 8 images · non-contrast
Comparison: None.
COMPARISON: None.

Addendum:
EXAM:
OVER-READ INTERPRETATION  CT CHEST

The following report is an over-read performed by radiologist Dr.
Ugbad Benevides [[email or web address]] on 06/12/2021. This
over-read does not include interpretation of cardiac or coronary
anatomy or pathology. The coronary calcium score/coronary CTA
interpretation by the cardiologist is attached.
CLINICAL DATA: 60F with chest pain
Cardiac/Coronary CTA
TECHNIQUE: The patient was scanned on a [employer] Force scanner.

[Series 757: coronaries · 6 of 8 slices shown, 8 images]
[im 2/8  vessel]
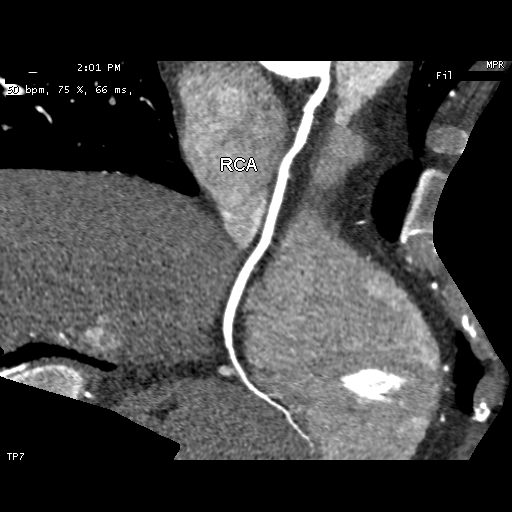
[im 2/8  lung]
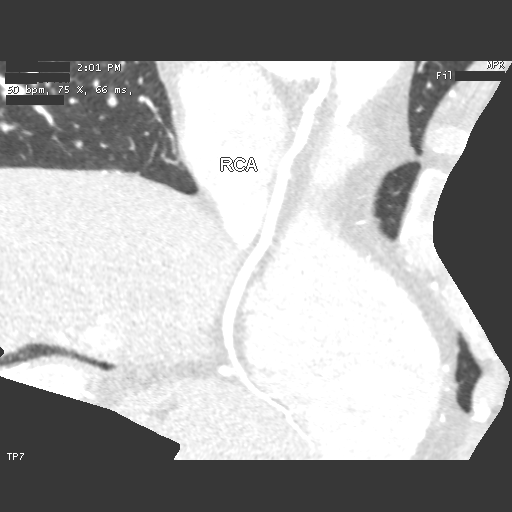
[im 3/8  vessel]
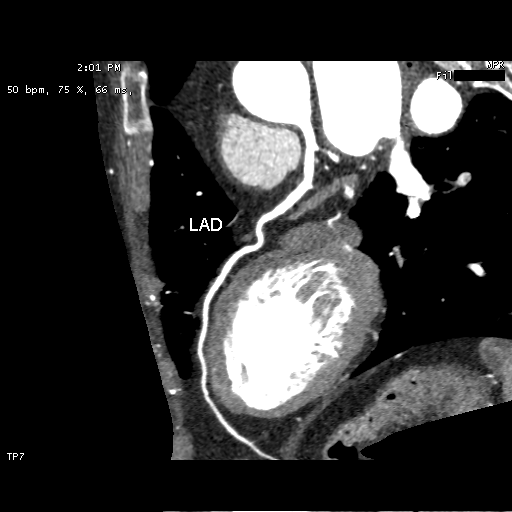
[im 4/8  vessel]
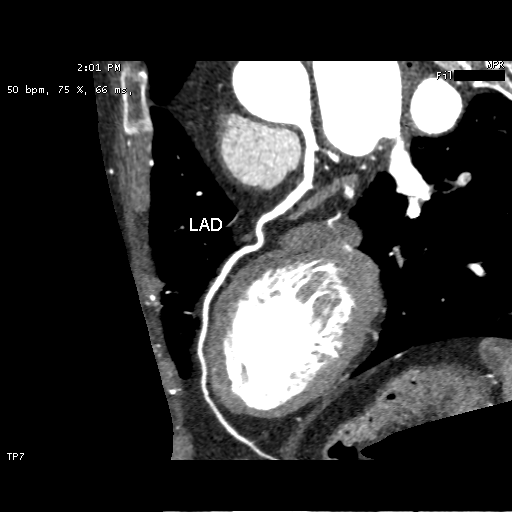
[im 5/8  vessel]
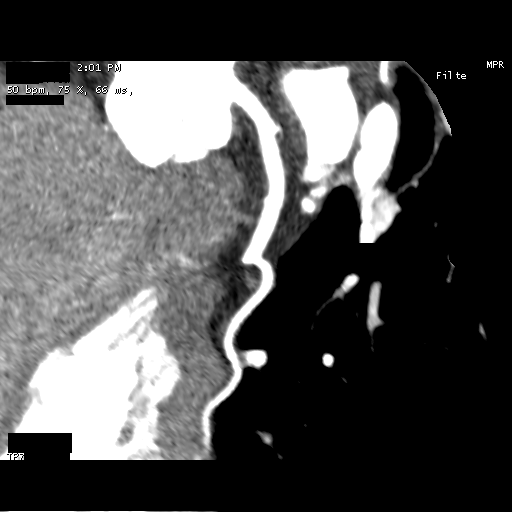
[im 6/8  vessel]
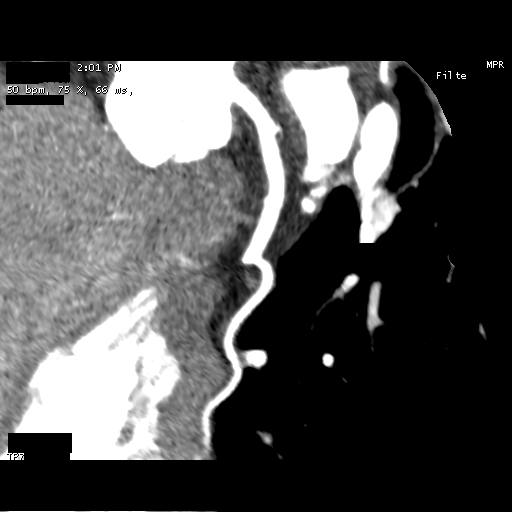
[im 6/8  lung]
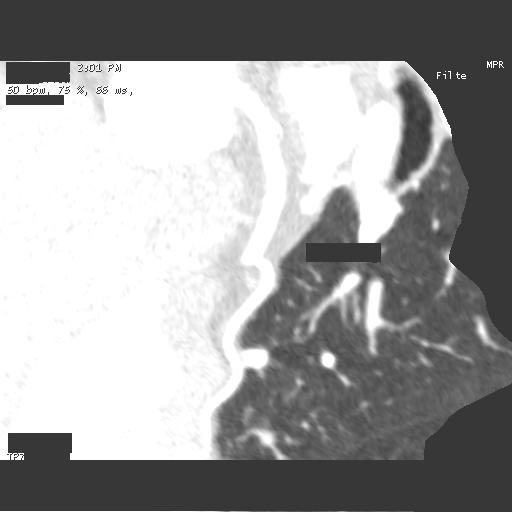
[im 7/8  vessel]
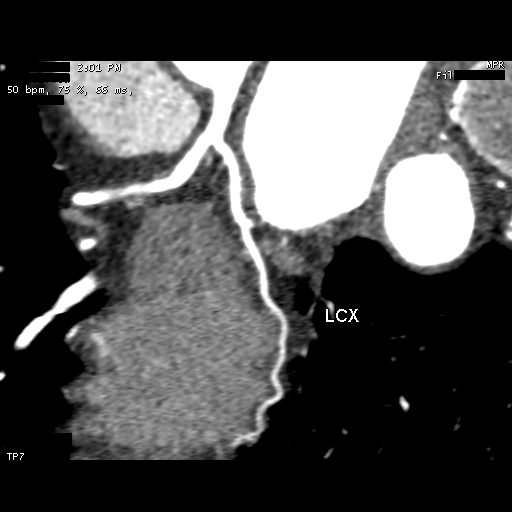

[6 of 8 positions shown; findings below may reference images not displayed]

FINDINGS: Within the visualized portions of the thorax there are no suspicious
appearing pulmonary nodules or masses, there is no acute
consolidative airspace disease, no pleural effusions, no
pneumothorax and no lymphadenopathy. Visualized portions of the
upper abdomen are unremarkable. There are no aggressive appearing
lytic or blastic lesions noted in the visualized portions of the
skeleton.
IMPRESSION: No significant incidental noncardiac findings are noted.
FINDINGS: A 100 kV prospective scan was triggered in the descending thoracic
aorta at 111 HU's. Axial non-contrast 3 mm slices were carried out
through the heart. The data set was analyzed on a dedicated work
station and scored using the Agatson method. Gantry rotation speed
was 250 msecs and collimation was .6 mm. 0.8 mg of sl NTG was given.
The 3D data set was reconstructed in 5% intervals of the 35-75 % of
the R-R cycle. Phases were analyzed on a dedicated work station
using MPR, MIP and VRT modes. The patient received 80 cc of
contrast.

Coronary Arteries:  Normal coronary origin.  Right dominance.

RCA is a large dominant artery that gives rise to PDA and PLA. There
is noncalcified plaque in the proximal RCA causing 25-49% stenosis

Left main is a large artery that gives rise to LAD and LCX arteries.

LAD is a large vessel that has no plaque.

LCX is a non-dominant artery that gives rise to one large OM1
branch. There is no plaque.

Other findings:

Left Ventricle: Normal size

Left Atrium: Normal size

Pulmonary Veins: Normal configuration

Right Ventricle: Normal size

Right Atrium: Normal size

Cardiac valves: Mild AV calcifications

Thoracic aorta: Normal size

Pulmonary Arteries: Normal size

Systemic Veins: Normal drainage

Pericardium: Normal thickness
IMPRESSION: 1. Coronary calcium score of 0.

2. Normal coronary origin with right dominance.

3. Nonobstructive CAD with noncalcified plaque in proximal RCA
causing mild (25-49%) stenosis.

CAD-RADS 2. Mild non-obstructive CAD (25-49%). Consider
non-atherosclerotic causes of chest pain. Consider preventive
therapy and risk factor modification.

*** End of Addendum ***
EXAM:
OVER-READ INTERPRETATION  CT CHEST

The following report is an over-read performed by radiologist Dr.
Ugbad Benevides [REDACTED] on 06/12/2021. This
over-read does not include interpretation of cardiac or coronary
anatomy or pathology. The coronary calcium score/coronary CTA
interpretation by the cardiologist is attached.
FINDINGS: Within the visualized portions of the thorax there are no suspicious
appearing pulmonary nodules or masses, there is no acute
consolidative airspace disease, no pleural effusions, no
pneumothorax and no lymphadenopathy. Visualized portions of the
upper abdomen are unremarkable. There are no aggressive appearing
lytic or blastic lesions noted in the visualized portions of the
skeleton.
IMPRESSION: No significant incidental noncardiac findings are noted.

## 2022-10-30 DIAGNOSIS — F419 Anxiety disorder, unspecified: Secondary | ICD-10-CM | POA: Diagnosis not present

## 2022-10-30 DIAGNOSIS — R109 Unspecified abdominal pain: Secondary | ICD-10-CM | POA: Diagnosis not present

## 2022-10-30 DIAGNOSIS — Z6824 Body mass index (BMI) 24.0-24.9, adult: Secondary | ICD-10-CM | POA: Diagnosis not present

## 2022-10-30 NOTE — Progress Notes (Deleted)
Initial neurology clinic note  Denise Foley MRN: [id number] DOB: [birth year]  Referring provider: Carvel Getting Key, *  Primary care provider: Marga Hoots, NP  Reason for consult:  headache  Subjective:  This is Denise Foley, a 61 y.o. ***-handed female with a medical history of HLD, hypothyroidism, migraines, arthritis*** who presents to neurology clinic with ***. The patient is accompanied by ***.  *** Left temporal, pulsating pain, moderate in severity. Associated photophobia and phonophobia and nausea. Aura with flashing lights? Triggers: weather changes  Was on Sumatriptan and had TIA*** Tried Nurtec 03/2022 [place] 05/2022  Per [facility] note from 03/13/18: Did not tolerate Topamax, Propranolol, Depakote, Gabapentin, Lyrica, Imitrex, and Maxalt. She was also given trigger point injections and Botox injections.  She has had neck pain since her late thirties. Currently it is midline and radiates to her trapezius muscles, between her shoulder blades and to the base of her skull.  Butterbur extract was working at that time  Currrent medications: ***  Vitamins: Vit D, B12 1000 mcg daily  Previously seen by a neurologist in [place] (also [facility]?)  MEDICATIONS:  Outpatient Encounter Medications as of 11/01/2022  Medication Sig   alendronate (FOSAMAX) 70 MG tablet Take 70 mg by mouth once a week. Take with a full glass of water on an empty stomach.   ALPRAZolam (XANAX) 0.25 MG tablet Take 0.25 mg by mouth at bedtime as needed for anxiety.   aspirin EC 81 MG tablet Take 81 mg by mouth daily. Swallow whole.   Calcium Carbonate-Vitamin D ([employer]S CALCIUM/VIT D SOFT CHEWS PO) Take 2 tablets by mouth daily.   clotrimazole-betamethasone (LOTRISONE) cream Apply 1 application topically as needed (fungus).   cyclobenzaprine (FLEXERIL) 10 MG tablet Take 10 mg by mouth at bedtime.   FLUDROCORTISONE ACETATE PO Take 0.1 mg by mouth daily. Take 1/2 tablet daily   hydrocortisone (CORTEF)  5 MG tablet Take 5 mg by mouth 2 (two) times daily.   lansoprazole (PREVACID) 15 MG capsule Take 15 mg by mouth daily at 12 noon.   levothyroxine (SYNTHROID) 88 MCG tablet Take 88 mcg by mouth daily before breakfast.   liothyronine (CYTOMEL) 5 MCG tablet Take 5 mcg by mouth daily.   meclizine (ANTIVERT) 12.5 MG tablet Take 12.5 mg by mouth 3 (three) times daily as needed for dizziness.   Methylcobalamin (B12-ACTIVE) 1 MG CHEW Chew 1 m by mouth daily.   Multiple Vitamin (MULTIVITAMIN) tablet Take 1 tablet by mouth daily.   nystatin cream (MYCOSTATIN) Apply 1 application topically 2 (two) times daily.   ondansetron (ZOFRAN) 4 MG tablet Take 4 mg by mouth every 8 (eight) hours as needed for nausea or vomiting.   pravastatin (PRAVACHOL) 20 MG tablet Take 1 tablet (20 mg total) by mouth every evening.   Vitamin D, Ergocalciferol, (DRISDOL) 1.25 MG (50000 UNIT) CAPS capsule Take 50,000 Units by mouth every 7 (seven) days.   No facility-administered encounter medications on file as of 11/01/2022.    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   Osteoporosis    Pott's disease     PAST SURGICAL HISTORY: Past Surgical History:  Procedure Laterality Date   ABDOMINAL HYSTERECTOMY  1993   ADRENALECTOMY Bilateral 2002   CHOLECYSTECTOMY  2003  FRACTURE SURGERY Left 2005   ankle   PAROTIDECTOMY  1993   PVC ABLATION  2012   TONSILLECTOMY  1977   TUBAL LIGATION  1989   TUMOR EXCISION  2000   pituitary    ALLERGIES: Allergies  Allergen Reactions   Cymbalta [Duloxetine Hcl]    Depakote [Divalproex Sodium]    Lamictal [Lamotrigine]    Lyrica [Pregabalin]    Sumatriptan    Tegretol [Carbamazepine]    Toprol Xl [Metoprolol]    Trazodone And Nefazodone    Wellbutrin  [Bupropion]    Zonisamide    Tape Rash    FAMILY HISTORY: Family History  Problem Relation Age of Onset   Thyroid disease Mother    Lung cancer Mother    Liver cancer Mother    Congestive Heart Failure Father    Stroke Maternal Grandmother     SOCIAL HISTORY: Social History   Tobacco Use   Smoking status: Former    Types: Cigarettes   Smokeless tobacco: Never  Substance Use Topics   Alcohol use: Never   Drug use: Never   Social History   Social History Narrative   Not on file    Objective:  Vital Signs:  There were no vitals taken for this visit.  ***  Labs and Imaging review: Internal labs:  CMP (06/29/22) significant for Cr 1.66 ***  External labs: PTH (05/04/22) wnl Free T3 (05/04/22): elevated to 4.47 Free T4 (05/04/22): 1.1 Vit D (09/05/21): 72 HbA1c (12/02/2018): 5.4 ***  Imaging: CTH (04/21/21): Report only: Mild sinus disease, otherwise unremarkable  Assessment/Plan:  Sadae Arrazola is a 61 y.o. female who presents for evaluation of ***. *** has a relevant medical history of ***. *** neurological examination is pertinent for ***. Available diagnostic data is significant for ***. This constellation of symptoms and objective data would most likely localize to ***. ***  PLAN: -Blood work: *** ***  -Return to clinic ***  The impression above as well as the plan as outlined below were extensively discussed with the patient (in the company of ***) who voiced understanding. All questions were answered to their satisfaction.  The patient was counseled on pertinent fall precautions per the printed material provided today, and as noted under the "Patient Instructions" section below.***  When available, results of the above investigations and possible further recommendations will be communicated to the patient via telephone/MyChart. Patient to call office if not contacted after expected testing turnaround time.   Total time spent reviewing records, interview,  history/exam, documentation, and coordination of care on day of encounter:  *** min   Thank you for allowing me to participate in patient's care.  If I can answer any additional questions, I would be pleased to do so.  Kai Levins, MD   CC: Marga Hoots, NP [phone] [address] [place] [address]  CC: Referring provider: Marga Hoots, NP [phone] [address],  [place] [address]

## 2022-11-01 ENCOUNTER — Ambulatory Visit: Payer: Medicare Other | Admitting: Neurology

## 2022-12-06 NOTE — Progress Notes (Signed)
Initial neurology clinic note  Denise Foley MRN: [id number] DOB: [birth year]  Referring provider: Carvel Getting Key, *  Primary care provider: Marga Hoots, NP  Reason for consult:  headache  Subjective:  This is Denise Foley, a 62 y.o. right-handed female with a medical history of Addison's disease, CKD, HLD, hypothyroidism, migraines, arthritis who presents to neurology clinic with headaches. The patient is alone today.   Patient has had migraines since childhood. She mentions her migraines were worse in [place] and stress from prior marriage. They have been better since moving to [place] in 2018. The headaches are mostly over the right temple. It is a pulsating sensation and pounding with an occasional ice pick like sensation. She mentions weather changes are a trigger. Her average headache 7/10. She gets blurry vision, flashing lights prior to her migraines. She endorses photophobia and phonophobia and nausea. When she was still menstruating, the migraines were worse at that time and would cause vomiting and passing out. She may also have drooping of eyes, but no focal numbness or weakness with her headaches. The headaches are not positional. She may know it is coming a day prior to her headache. When she has a headache, it will usually last a day with a residual headache the next day. She has 1-2 migraines per month.   She has another type of headache. The headache is also located on the left temple, occasionally across the forehead. It is a pressure sensation, 3-4/10. It is basically a background constant headache between her migraines.   She has occasional neck pain. She mentions she has a lot of pain, but deals with it.  She has a strong family history of migraines. She drinks one cup of green tea per day. No EtOH or tobacco use.  Patient does not sleep well. She states she does not get much sleep, maybe anxiety driven by her report. She has had multiple sleep studies before  and told she does not have OSA but "does not get any REM sleep" per her report. She endorses daytime sleepiness and waking not feeling well rested.  Patient is currently taking just extra strength tylenol for her headaches. She takes this 4-5 times per week.  Patient has tried multiple medications in the past.  Patient was on Sumatriptan and had left sided numbness and felt weird. She went to the ED and felt she may have had a TIA and told to stop sumatriptan.  Per [facility] note from 03/13/18: Did not tolerate Topamax, Propranolol, Depakote, Gabapentin, Lyrica, Cymbalta, Imitrex, and Maxalt.  She was also given trigger point injections. This helped some. She got Botox injections but in her chest, not her head.  She has had neck pain since her late thirties. Currently it is midline and radiates to her trapezius muscles, between her shoulder blades and to the base of her skull.  Butterbur extract was working at that time but then stopped working.  Tried Nurtec sample in 03/2022 (2 tablets) worked a little (took the edge off). Tried Ubrelvy sample in 05/2022 which she is not sure of benefit. Tried Aimovig (1 shot) - unclear benefit   Vitamins: Vit D, B12 1000 mcg daily.  She has not tried amitriptyline, nortriptyline, venlafaxine, or lamotrigine.  Patient mentions a history of a neck mass and removal of a pituitary macroadenoma as well.  MEDICATIONS:  Outpatient Encounter Medications as of 12/14/2022  Medication Sig   alendronate (FOSAMAX) 70 MG tablet Take 70 mg by mouth once a week.  Take with a full glass of water on an empty stomach.   ALPRAZolam (XANAX) 0.25 MG tablet Take 0.25 mg by mouth at bedtime as needed for anxiety.   aspirin EC 81 MG tablet Take 81 mg by mouth daily. Swallow whole.   Calcium Carbonate-Vitamin D ([employer]S CALCIUM/VIT D SOFT CHEWS PO) Take 2 tablets by mouth daily.   clotrimazole-betamethasone (LOTRISONE) cream Apply 1 application topically as needed (fungus).    cyclobenzaprine (FLEXERIL) 10 MG tablet Take 10 mg by mouth at bedtime.   FLUDROCORTISONE ACETATE PO Take 0.1 mg by mouth daily. Take 1/2 tablet daily   hydrocortisone (CORTEF) 5 MG tablet Take 5 mg by mouth 2 (two) times daily.   lansoprazole (PREVACID) 15 MG capsule Take 15 mg by mouth daily at 12 noon.   levothyroxine (SYNTHROID) 88 MCG tablet Take 88 mcg by mouth daily before breakfast.   liothyronine (CYTOMEL) 5 MCG tablet Take 5 mcg by mouth daily.   Methylcobalamin (B12-ACTIVE) 1 MG CHEW Chew 1 m by mouth daily.   Multiple Vitamin (MULTIVITAMIN) tablet Take 1 tablet by mouth daily.   nystatin cream (MYCOSTATIN) Apply 1 application topically 2 (two) times daily.   ondansetron (ZOFRAN) 4 MG tablet Take 4 mg by mouth every 8 (eight) hours as needed for nausea or vomiting.   Vitamin D, Ergocalciferol, (DRISDOL) 1.25 MG (50000 UNIT) CAPS capsule Take 50,000 Units by mouth every 7 (seven) days.   meclizine (ANTIVERT) 12.5 MG tablet Take 12.5 mg by mouth 3 (three) times daily as needed for dizziness.   pravastatin (PRAVACHOL) 20 MG tablet Take 1 tablet (20 mg total) by mouth every evening.   No facility-administered encounter medications on file as of 12/14/2022.    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   Osteoporosis    Pott's disease     PAST SURGICAL HISTORY: Past Surgical History:  Procedure Laterality Date   ABDOMINAL HYSTERECTOMY  1993   ADRENALECTOMY Bilateral 2002   CHOLECYSTECTOMY  2003   FRACTURE SURGERY Left 2005   ankle   PAROTIDECTOMY  1993   PVC ABLATION  2012   TONSILLECTOMY  1977   TUBAL LIGATION  1989   TUMOR EXCISION  2000   pituitary    ALLERGIES: Allergies  Allergen Reactions   Cymbalta [Duloxetine Hcl]    Depakote [Divalproex  Sodium]    Lamictal [Lamotrigine]    Lyrica [Pregabalin]    Sumatriptan    Tegretol [Carbamazepine]    Toprol Xl [Metoprolol]    Trazodone And Nefazodone    Wellbutrin [Bupropion]    Zonisamide    Tape Rash    FAMILY HISTORY: Family History  Problem Relation Age of Onset   Thyroid disease Mother    Lung cancer Mother    Liver cancer Mother    Congestive Heart Failure Father    Stroke Maternal Grandmother     SOCIAL HISTORY: Social History   Tobacco Use   Smoking status: Former    Types: Cigarettes   Smokeless tobacco: Never  Vaping Use   Vaping Use: Never used  Substance Use Topics   Alcohol use: Never   Drug use: Never   Social History   Social History Narrative   Are you right handed or left handed? Right  Are you currently employed ? no   What is your current occupation?   Do you live at home alone? daughter   Who lives with you?    What type of home do you live in: 1 story or 2 story? two    Caffeine 1 cup daily    Objective:  Vital Signs:  BP 136/83   Pulse 75   Ht '5\' 4"'$  (1.626 m)   Wt 142 lb (64.4 kg)   SpO2 100%   BMI 24.37 kg/m   General: No acute distress.  Patient appears well-groomed.   Head:  Normocephalic/atraumatic. Tenderness to palpation of right temple. Eyes:  fundi examined but only partially visualized. No obvious papilledema. Neck: supple, paraspinal tenderness, mildly reduced range of motion Back: No paraspinal tenderness Heart: regular rate and rhythm Lungs: Clear to auscultation bilaterally. Vascular: No carotid bruits.  Neurological Exam: Mental status: alert and oriented to person, place, and time, speech fluent and not dysarthric, language intact.  Cranial nerves: CN I: not tested CN II: pupils equal, round and reactive to light, visual fields intact CN III, IV, VI:  full range of motion, no nystagmus, no ptosis CN V: facial sensation intact. CN VII: upper and lower face symmetric CN VIII: hearing intact CN IX, X:  gag intact, uvula midline CN XI: sternocleidomastoid and trapezius muscles intact CN XII: tongue midline  Bulk & Tone: normal, no fasciculations. Motor:  muscle strength 5/5 throughout Deep Tendon Reflexes:  2+ throughout Sensation:  Pinprick, temperature and vibratory sensation intact. Finger to nose testing:  Without dysmetria.  Gait:  Normal station and stride.  Romberg negative.   Labs and Imaging review: Internal labs: CMP (06/29/22) significant for Cr 1.66   External labs: PTH (05/04/22) wnl Free T3 (05/04/22): elevated to 4.47 Free T4 (05/04/22): 1.1 Vit D (09/05/21): 72 HbA1c (12/02/2018): 5.4   Imaging: CTH (04/21/21): Report only: Mild sinus disease, otherwise unremarkable  MRI brain wo contrast (external, report only - 05/22/16): Indication: left face droop, concern for stroke.  Impression: No evidence of acute intracranial process.  MRI brain w/wo contrast (external, report only - 03/09/2016):  Indication: Headaches, problems with left eye, neck mass seen on prior MRI. Intracranial mass. Pituitary macroadenoma.  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   Assessment/Plan:  Denise Foley is a 62 y.o. female who presents for evaluation of headaches. She has a relevant medical history of Addison's disease, CKD, HLD, hypothyroidism, migraines, arthritis, pituitary macroadenoma, ?neck mass, ?intracranial mass. Her neurological examination is essentially normal. 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 Patient has a long history of migraines and is currently not on any medications. She is currently taking frequent tylenol, which may be contributing to rebound headaches (medication overuse headaches). I will repeat MRI brain and cervical spine due to concern for masses as mentioned on previous reports. I will treat as below.  PLAN: -Blood work: TSH, ESR, CRP -MRI brain and cervical spine w/wo contrast -Migraine prevention: Nortriptyline 10 mg for 1 week, then increase to 20 mg qhs -Migraine rescue:  Nurtec 75 mg PRN, Zofran for nausea -Limit use of pain relievers to no more than 2 days out of week to prevent risk of rebound or medication-overuse headache. -Keep headache diary -Follow up 1 month  The impression above as well as the plan as outlined below were extensively discussed with the patient who voiced understanding. All questions were answered to their satisfaction.  When available, results of the above investigations and possible further recommendations will be communicated to the patient via telephone/MyChart. Patient to call office if not contacted after expected testing turnaround time.   Total time spent reviewing records, interview, history/exam, documentation, and coordination of care on day of encounter:  70 min   Thank you for allowing me to participate in patient's care.  If I can answer any additional questions, I would be pleased to do so.  Kai Levins, MD   CC: Marga Hoots, NP [phone] [address] [place] [address]  CC: Referring provider: Marga Hoots, NP [phone] [address],  [place] [address]

## 2022-12-14 ENCOUNTER — Encounter: Payer: Self-pay | Admitting: Neurology

## 2022-12-14 ENCOUNTER — Ambulatory Visit: Payer: Medicare Other | Admitting: Neurology

## 2022-12-14 ENCOUNTER — Other Ambulatory Visit (INDEPENDENT_AMBULATORY_CARE_PROVIDER_SITE_OTHER): Payer: Medicare Other

## 2022-12-14 VITALS — BP 136/83 | HR 75 | Ht 64.0 in | Wt 142.0 lb

## 2022-12-14 DIAGNOSIS — G9389 Other specified disorders of brain: Secondary | ICD-10-CM

## 2022-12-14 DIAGNOSIS — D352 Benign neoplasm of pituitary gland: Secondary | ICD-10-CM | POA: Diagnosis not present

## 2022-12-14 DIAGNOSIS — R221 Localized swelling, mass and lump, neck: Secondary | ICD-10-CM | POA: Diagnosis not present

## 2022-12-14 DIAGNOSIS — G43109 Migraine with aura, not intractable, without status migrainosus: Secondary | ICD-10-CM

## 2022-12-14 DIAGNOSIS — G4440 Drug-induced headache, not elsewhere classified, not intractable: Secondary | ICD-10-CM

## 2022-12-14 LAB — C-REACTIVE PROTEIN: CRP: <1 mg/dL (ref 0.5–20.0)

## 2022-12-14 LAB — SEDIMENTATION RATE: Sed Rate: 19 mm/hr (ref 0–30)

## 2022-12-14 LAB — TSH: TSH: <0.01 u[IU]/mL — ABNORMAL LOW (ref 0.35–5.50)

## 2022-12-14 MED ORDER — NORTRIPTYLINE HCL 10 MG PO CAPS: 20.0000 mg | ORAL_CAPSULE | Freq: Every day | ORAL | 5 refills | Status: DC

## 2022-12-14 MED ORDER — NURTEC 75 MG PO TBDP: 75.0000 mg | ORAL_TABLET | ORAL | 5 refills | Status: DC | PRN

## 2022-12-14 NOTE — Patient Instructions (Signed)
I saw you today for headaches. I want to investigate further with: -Blood work today -MRI brain and cervical spine w/wo contrast  For your headaches -Migraine prevention: Nortriptyline 10 mg at bedtime every night for 1 week, then increase to 20 mg at bedtime every night if no side effects. -Migraine rescue:  Nurtec 75 mg PRN, Zofran for nausea -Limit use of pain relievers to no more than 2 days out of week to prevent risk of rebound or medication-overuse headache. -Keep headache diary -Follow up 1 month  If you have problems with the medication, please let me know.

## 2022-12-18 ENCOUNTER — Other Ambulatory Visit (HOSPITAL_COMMUNITY): Payer: Self-pay

## 2022-12-19 DIAGNOSIS — F419 Anxiety disorder, unspecified: Secondary | ICD-10-CM | POA: Diagnosis not present

## 2022-12-19 DIAGNOSIS — Z6824 Body mass index (BMI) 24.0-24.9, adult: Secondary | ICD-10-CM | POA: Diagnosis not present

## 2022-12-20 ENCOUNTER — Telehealth: Payer: Self-pay | Admitting: Anesthesiology

## 2022-12-20 NOTE — Telephone Encounter (Signed)
Pt called stating her pharmacy said her Nurtec needs a PA. Pt has a couple of tabs left.

## 2022-12-24 DIAGNOSIS — E038 Other specified hypothyroidism: Secondary | ICD-10-CM | POA: Diagnosis not present

## 2022-12-24 DIAGNOSIS — E559 Vitamin D deficiency, unspecified: Secondary | ICD-10-CM | POA: Diagnosis not present

## 2022-12-24 DIAGNOSIS — E2740 Unspecified adrenocortical insufficiency: Secondary | ICD-10-CM | POA: Diagnosis not present

## 2022-12-24 DIAGNOSIS — E041 Nontoxic single thyroid nodule: Secondary | ICD-10-CM | POA: Diagnosis not present

## 2022-12-24 NOTE — Telephone Encounter (Signed)
Patient states that the nurtec needs a prior auth and she uses the [facility]  [address] in [place]

## 2022-12-26 ENCOUNTER — Other Ambulatory Visit: Payer: Self-pay | Admitting: Cardiology

## 2022-12-26 DIAGNOSIS — N1832 Chronic kidney disease, stage 3b: Secondary | ICD-10-CM

## 2022-12-26 DIAGNOSIS — I493 Ventricular premature depolarization: Secondary | ICD-10-CM

## 2022-12-26 DIAGNOSIS — I951 Orthostatic hypotension: Secondary | ICD-10-CM

## 2022-12-26 MED ORDER — PRAVASTATIN SODIUM 20 MG PO TABS: 20.0000 mg | ORAL_TABLET | Freq: Every evening | ORAL | 1 refills | Status: DC

## 2022-12-26 NOTE — Telephone Encounter (Signed)
Rx refill sent to pharmacy. 

## 2022-12-31 ENCOUNTER — Telehealth: Payer: Self-pay | Admitting: Pharmacy Technician

## 2022-12-31 NOTE — Telephone Encounter (Signed)
Patient Advocate Encounter   Received notification that prior authorization for Nurtec '75MG'$  dispersible tablets is required.   PA submitted on 12/31/2022 Key [id number] Status is pending       Lyndel Safe, [place] Patient Advocate Specialist [place] Patient Advocate Team Direct Number: [phone]  Fax: [phone]

## 2023-01-01 NOTE — Telephone Encounter (Signed)
Patient Advocate Encounter  Prior Authorization for Nurtec '75MG'$  dispersible tablets has been approved.     Effective dates: 12/31/2022 through 01/01/2024      Lyndel Safe, [place] Patient Advocate Specialist [place] Patient Advocate Team Direct Number: [phone]  Fax: [phone]

## 2023-01-02 DIAGNOSIS — E041 Nontoxic single thyroid nodule: Secondary | ICD-10-CM | POA: Diagnosis not present

## 2023-01-03 DIAGNOSIS — M810 Age-related osteoporosis without current pathological fracture: Secondary | ICD-10-CM | POA: Diagnosis not present

## 2023-01-04 ENCOUNTER — Encounter: Payer: Self-pay | Admitting: Neurology

## 2023-01-06 ENCOUNTER — Ambulatory Visit
Admission: RE | Admit: 2023-01-06 | Discharge: 2023-01-06 | Disposition: A | Discharge status: Home / Self Care | Payer: Medicare Other | Source: Ambulatory Visit | Attending: Neurology | Admitting: Neurology

## 2023-01-06 DIAGNOSIS — R221 Localized swelling, mass and lump, neck: Secondary | ICD-10-CM

## 2023-01-06 DIAGNOSIS — G9389 Other specified disorders of brain: Secondary | ICD-10-CM

## 2023-01-06 DIAGNOSIS — D352 Benign neoplasm of pituitary gland: Secondary | ICD-10-CM

## 2023-01-06 DIAGNOSIS — G43109 Migraine with aura, not intractable, without status migrainosus: Secondary | ICD-10-CM

## 2023-01-06 DIAGNOSIS — M4802 Spinal stenosis, cervical region: Secondary | ICD-10-CM | POA: Diagnosis not present

## 2023-01-06 DIAGNOSIS — M50223 Other cervical disc displacement at C6-C7 level: Secondary | ICD-10-CM | POA: Diagnosis not present

## 2023-01-06 DIAGNOSIS — G43909 Migraine, unspecified, not intractable, without status migrainosus: Secondary | ICD-10-CM | POA: Diagnosis not present

## 2023-01-06 MED ORDER — GADOPICLENOL 0.5 MMOL/ML IV SOLN
7.0000 mL | Freq: Once | INTRAVENOUS | Status: AC | PRN
  Administered 2023-01-06: 7 mL via INTRAVENOUS

## 2023-01-08 ENCOUNTER — Encounter: Payer: Self-pay | Admitting: Neurology

## 2023-01-08 DIAGNOSIS — N1832 Chronic kidney disease, stage 3b: Secondary | ICD-10-CM | POA: Diagnosis not present

## 2023-01-15 DIAGNOSIS — N1832 Chronic kidney disease, stage 3b: Secondary | ICD-10-CM | POA: Diagnosis not present

## 2023-01-15 DIAGNOSIS — E2740 Unspecified adrenocortical insufficiency: Secondary | ICD-10-CM | POA: Diagnosis not present

## 2023-01-15 DIAGNOSIS — I129 Hypertensive chronic kidney disease with stage 1 through stage 4 chronic kidney disease, or unspecified chronic kidney disease: Secondary | ICD-10-CM | POA: Diagnosis not present

## 2023-01-15 DIAGNOSIS — R7989 Other specified abnormal findings of blood chemistry: Secondary | ICD-10-CM | POA: Diagnosis not present

## 2023-01-16 DIAGNOSIS — G43909 Migraine, unspecified, not intractable, without status migrainosus: Secondary | ICD-10-CM | POA: Diagnosis not present

## 2023-01-16 DIAGNOSIS — F419 Anxiety disorder, unspecified: Secondary | ICD-10-CM | POA: Diagnosis not present

## 2023-01-16 DIAGNOSIS — Z6824 Body mass index (BMI) 24.0-24.9, adult: Secondary | ICD-10-CM | POA: Diagnosis not present

## 2023-01-18 NOTE — Progress Notes (Addendum)
NEUROLOGY FOLLOW UP OFFICE NOTE  Denise Foley [id number]  Subjective:  Denise Foley is a 62 y.o. year old right-handed female with a medical history of Addison's disease, CKD, HLD, hypothyroidism, migraines, arthritis who we last saw on 12/14/22.  To briefly review: Patient has had migraines since childhood. She mentions her migraines were worse in [place] and stress from prior marriage. They have been better since moving to [place] in 2018. The headaches are mostly over the right temple. It is a pulsating sensation and pounding with an occasional ice pick like sensation. She mentions weather changes are a trigger. Her average headache 7/10. She gets blurry vision, flashing lights prior to her migraines. She endorses photophobia and phonophobia and nausea. When she was still menstruating, the migraines were worse at that time and would cause vomiting and passing out. She may also have drooping of eyes, but no focal numbness or weakness with her headaches. The headaches are not positional. She may know it is coming a day prior to her headache. When she has a headache, it will usually last a day with a residual headache the next day. She has 1-2 migraines per month.    She has another type of headache. The headache is also located on the left temple, occasionally across the forehead. It is a pressure sensation, 3-4/10. It is basically a background constant headache between her migraines.    She has occasional neck pain. She mentions she has a lot of pain, but deals with it.   She has a strong family history of migraines. She drinks one cup of green tea per day. No EtOH or tobacco use.   Patient does not sleep well. She states she does not get much sleep, maybe anxiety driven by her report. She has had multiple sleep studies before and told she does not have OSA but "does not get any REM sleep" per her report. She endorses daytime sleepiness and waking not feeling well rested.   Patient is currently taking  just extra strength tylenol for her headaches. She takes this 4-5 times per week.   Patient has tried multiple medications in the past.  Patient was on Sumatriptan and had left sided numbness and felt weird. She went to the ED and felt she may have had a TIA and told to stop sumatriptan.   Per [facility] note from 03/13/18: Did not tolerate Topamax, Propranolol, Depakote, Gabapentin, Lyrica, Cymbalta, Imitrex, and Maxalt.  She was also given trigger point injections. This helped some. She got Botox injections but in her chest, not her head.  She has had neck pain since her late thirties. Currently it is midline and radiates to her trapezius muscles, between her shoulder blades and to the base of her skull.  Butterbur extract was working at that time but then stopped working.   Tried Nurtec sample in 03/2022 (2 tablets) worked a little (took the edge off). Tried Ubrelvy sample in 05/2022 which she is not sure of benefit. Tried Aimovig (1 shot) - unclear benefit   Vitamins: Vit D, B12 1000 mcg daily.   She has not tried amitriptyline, nortriptyline, venlafaxine, or lamotrigine.   Patient mentions a history of a neck mass and removal of a pituitary macroadenoma as well.  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 Patient has a long history of migraines and is currently not on any medications. She  is currently taking frequent tylenol, which may be contributing to rebound headaches (medication overuse headaches). I will repeat MRI brain and cervical spine due to concern for masses as mentioned on previous reports. I will treat as below.   PLAN: -Blood work: TSH, ESR, CRP -MRI brain and cervical spine w/wo contrast -Migraine prevention: Nortriptyline 10 mg for 1 week, then increase to 20 mg qhs -Migraine rescue:  Nurtec 75 mg PRN, Zofran for  nausea -Limit use of pain relievers to no more than 2 days out of week to prevent risk of rebound or medication-overuse headache. -Keep headache diary -Follow up 1 month  Since their last visit: MRI brain was similar to prior. MRI cervical spine was unremarkable. TSH was < 0.01. I recommended she contact her PCP or endocrinologist about this. Her ESR and CRP were normal.  Patient was having racing heart since starting Nortriptyline. Patient was not sure if it was related to nightly xanax, so she stopped the xanax. Then she had swelling and twitching of tongue. She does have a history of angioedema. She stopped the Nortriptyline after this. The Nortriptyline was only taken for 12 days.  She has had one migraine since her last appointment. Nurtec has really helped. She still has a background daily headache.  MEDICATIONS:  Outpatient Encounter Medications as of 01/24/2023  Medication Sig   ALPRAZolam (XANAX) 0.25 MG tablet Take 0.25 mg by mouth at bedtime as needed for anxiety.   aspirin EC 81 MG tablet Take 81 mg by mouth daily. Swallow whole.   Calcium Carbonate-Vitamin D ([employer] CALCIUM/VIT D SOFT CHEWS PO) Take 5,000 capsules by mouth every 30 (thirty) days.   clotrimazole-betamethasone (LOTRISONE) cream Apply 1 application topically as needed (fungus).   cyclobenzaprine (FLEXERIL) 10 MG tablet Take 10 mg by mouth at bedtime.   FLUDROCORTISONE ACETATE PO Take 0.1 mg by mouth daily. Take 1/2 tablet daily   hydrocortisone (CORTEF) 5 MG tablet Take 5 mg by mouth 2 (two) times daily.   lansoprazole (PREVACID) 15 MG capsule Take 15 mg by mouth daily at 12 noon.   levothyroxine (SYNTHROID) 88 MCG tablet Take 88 mcg by mouth daily before breakfast.   liothyronine (CYTOMEL) 5 MCG tablet Take 5 mcg by mouth daily.   Methylcobalamin (B12-ACTIVE) 1 MG CHEW Chew 1 m by mouth daily.   Multiple Vitamin (MULTIVITAMIN) tablet Take 1 tablet by mouth daily.   nystatin cream (MYCOSTATIN) Apply 1 application  topically 2 (two) times daily.   ondansetron (ZOFRAN) 4 MG tablet Take 4 mg by mouth every 8 (eight) hours as needed for nausea or vomiting.   pravastatin (PRAVACHOL) 20 MG tablet Take 1 tablet (20 mg total) by mouth every evening.   Rimegepant Sulfate (NURTEC) 75 MG TBDP Take 1 tablet (75 mg total) by mouth as needed.   topiramate (TOPAMAX) 25 MG tablet Take 1 tablet (25 mg total) by mouth at bedtime.   Vitamin D, Ergocalciferol, (DRISDOL) 1.25 MG (50000 UNIT) CAPS capsule Take 50,000 Units by mouth every 7 (seven) days.   alendronate (FOSAMAX) 70 MG tablet Take 70 mg by mouth once a week. Take with a full glass of water on an empty stomach. (Patient not taking: Reported on 01/24/2023)   [DISCONTINUED] nortriptyline (PAMELOR) 10 MG capsule Take 2 capsules (20 mg total) by mouth at bedtime. Take 1 capsule (10 mg today) for 1 week at bedtime, if no side effects, then increase to 2 capsules (20 mg) at bedtime thereafter.   No facility-administered encounter medications on  file as of 01/24/2023.    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   Osteoporosis    Pott's disease     PAST SURGICAL HISTORY: Past Surgical History:  Procedure Laterality Date   ABDOMINAL HYSTERECTOMY  1993   ADRENALECTOMY Bilateral 2002   CHOLECYSTECTOMY  2003   FRACTURE SURGERY Left 2005   ankle   PAROTIDECTOMY  1993   PVC ABLATION  2012   Advance   TUMOR EXCISION  2000   pituitary    ALLERGIES: Allergies  Allergen Reactions   Nortriptyline Hcl Other (See Comments)    angioedema   Cymbalta [Duloxetine Hcl]    Depakote [Divalproex Sodium]    Lamictal [Lamotrigine]    Lyrica [Pregabalin]    Sumatriptan    Tegretol [Carbamazepine]    Toprol Xl  [Metoprolol]    Trazodone And Nefazodone    Wellbutrin [Bupropion]    Zonisamide    Tape Rash    FAMILY HISTORY: Family History  Problem Relation Age of Onset   Thyroid disease Mother    Lung cancer Mother    Liver cancer Mother    Congestive Heart Failure Father    Stroke Maternal Grandmother     SOCIAL HISTORY: Social History   Tobacco Use   Smoking status: Former    Types: Cigarettes   Smokeless tobacco: Never  Vaping Use   Vaping Use: Never used  Substance Use Topics   Alcohol use: Never   Drug use: Never   Social History   Social History Narrative   Are you right handed or left handed? Right   Are you currently employed ? no   What is your current occupation?   Do you live at home alone? daughter   Who lives with you?    What type of home do you live in: 1 story or 2 story? two    Caffeine 1 cup daily      Objective:  Vital Signs:  BP (!) 140/81   Pulse 84   Ht '5\' 4"'$  (1.626 m)   Wt 144 lb (65.3 kg)   SpO2 100%   BMI 24.72 kg/m   General: No acute distress.  Patient appears well-groomed.   Head:  Normocephalic/atraumatic Neck: supple Lungs:  Non-labored breathing on room air Neurological Exam: alert and oriented to person, place, and time.  Speech fluent and not dysarthric, language intact.  CN II-XII intact. Bulk and tone normal, muscle strength 5/5 throughout.  Sensation to light touch intact.  Deep tendon reflexes 2+ throughout.  Gait normal, Romberg negative.   Labs and Imaging review: New results: 12/14/22: TSH: < 0.01 ESR: 19 CRP: < 1.0  MRI brain w/wo contrast (01/06/23): FINDINGS: Brain: There is no evidence of acute infarct, hemorrhage, midline shift or extra-axial fluid collection. The ventricles and sulci are normal. A few punctate foci of T2 hyperintensity in the cerebral white matter are nonspecific and within normal limits for age.   Dedicated imaging was performed through the sella turcica. Sequelae of transsphenoidal  pituitary surgery are identified. A small amount of enhancing soft tissue along the sellar floor bilaterally likely reflects normal residual pituitary, with the infundibulum inserting into this tissue to the left of midline. There  is a 6 mm focus of enhancing soft tissue extending from the ventral margin of the sella into the superior aspect of the sphenoid sinus on the left which is partially inseparable from and demonstrates similar signal characteristics to residual pituitary tissue. The optic chiasm and cavernous sinuses are unremarkable.   Vascular: Major intracranial vascular flow voids are preserved.   Skull and upper cervical spine: No suspicious marrow lesion.   Sinuses/Orbits: Unremarkable orbits. Postoperative changes in the sphenoid sinuses. Minimal mucosal thickening in the right maxillary sinus. Clear mastoid air cells.   Other: In the carotid space of the right upper neck medial to the internal jugular vein and just below the jugular foramen is an elongated mass measuring 8 x 9 x 29 mm (AP x transverse x craniocaudal). The majority of the mass is T2 hyperintense with suppression on FLAIR imaging, however the central portion of the mass is T2 hypointense and enhancing. This mass was also described in multiple prior outside MRI reports, and the portion of the mass included on the 2022 CT is fluid density.   IMPRESSION: 1. Postoperative changes from transsphenoidal pituitary surgery. 6 mm focus of enhancing soft tissue extending from the ventral margin of the sella into the superior aspect of the sphenoid sinus on the left could reflect postoperative granulation tissue or a small amount of residual tumor. 2. Otherwise unremarkable appearance of the brain for age. 3. 3 cm mass just below the jugular foramen in the carotid space of the right upper neck, longstanding based on prior outside MRI reports and benign in appearance, possibly a schwannoma or cyst  with traversing neural elements.  MRI cervical spine w/wo contrast (01/06/23): FINDINGS: Alignment: Trace retrolisthesis of C3 on C4 and C4 on C5.   Vertebrae: No fracture or suspicious marrow lesion. Modic type 1 endplate changes at 075-GRM.   Cord: Normal cord signal and morphology. No abnormal intradural enhancement.   Posterior Fossa, vertebral arteries, paraspinal tissues: The posterior fossa and the right carotid space mass in the upper neck are more fully evaluated on the contemporaneous head MRI. Preserved vertebral artery flow voids.   Disc levels:   Mild-to-moderate disc space narrowing from C3-4 through C6-7.   C2-3: Negative.   C3-4: Disc bulging and mild uncovertebral spurring without significant stenosis.   C4-5: Broad-based posterior disc osteophyte complex results in mild right neural foraminal stenosis without spinal stenosis.   C5-6: Disc bulging and uncovertebral spurring result in mild right neural foraminal stenosis without spinal stenosis.   C6-7: Disc bulging and uncovertebral spurring without significant stenosis.   C7-T1: Mild-to-moderate facet arthrosis without disc herniation or stenosis.   IMPRESSION: 1. Cervical disc and facet degeneration with mild right neural foraminal stenosis at C4-5 and C5-6. No spinal stenosis. 2. Right upper neck mass reported on today's separate head MRI.  Previously reviewed results: CMP (06/29/22) significant for Cr 1.66   External labs: PTH (05/04/22) wnl Free T3 (05/04/22): elevated to 4.47 Free T4 (05/04/22): 1.1 Vit D (09/05/21): 72 HbA1c (12/02/2018): 5.4   Imaging: CTH (04/21/21): Report only: Mild sinus disease, otherwise unremarkable   MRI brain wo contrast (external, report only - 05/22/16): Indication: left face droop, concern for stroke.   Impression: No evidence of acute intracranial process.   MRI brain w/wo contrast (external, report only - 03/09/2016):  Indication: Headaches, problems with left  eye, neck mass seen on prior MRI. Intracranial mass. Pituitary macroadenoma.   Impression: No acute abnormality Postop changes of prior transphenoidal pituitary surgery without macroadenoma  recurrence Tubular T2 hyperintense lesion in continuity with the right hypoglossal nerve just inferior to the canal measuring 8 x 8 mm in AP and transverse dimensions has a circumferential area T2 hyperintensity and an ovoid central area of T2 hyperintensity creating a target sign. 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  Assessment/Plan:  This is Denise Foley, a 62 y.o. female with migraine with aura. She currently has 1-2 migraines per month that respond well to Nurtec, but also has a daily background headache. She was not able to tolerate Nortriptyline due to tongue swelling. I will start Topamax today to try to prevent daily headaches. 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  Plan: -Migraine prevention: Switch to Topamax 25 mg qhs -Migraine rescue:  Nurtec 75 mg PRN, Zofran for nausea -Limit use of pain relievers to no more than 2 days out of week to prevent risk of rebound or medication-overuse headache. -Keep headache diary  Return to clinic in 3 months  Total time spent reviewing records, interview, history/exam, documentation, and coordination of care on day of encounter:  25 min  Jacquelyne Balint, MD   Addendum, 03/19/23 7:49 am: I spoke to patient by phone on 02/11/23 (see separate phone note from that date). Per that conversation, patient was not able to tolerate topamax and it did not help her headaches. She was needing to take Nurtec about 9 times per month for abortive care of migraines as headaches were increasing in  frequency. Due to this and her previous failure with topamax, propranolol, depakote, gabapentin, cymbalta, the decision was made to prescribe Aimovig 140 mg for migraine prevention. I have ordered this, which is pending approval.  Jacquelyne Balint, MD [facility]

## 2023-01-24 ENCOUNTER — Encounter: Payer: Self-pay | Admitting: Neurology

## 2023-01-24 ENCOUNTER — Ambulatory Visit: Payer: Medicare Other | Admitting: Neurology

## 2023-01-24 VITALS — BP 140/81 | HR 84 | Ht 64.0 in | Wt 144.0 lb

## 2023-01-24 DIAGNOSIS — D352 Benign neoplasm of pituitary gland: Secondary | ICD-10-CM

## 2023-01-24 DIAGNOSIS — R221 Localized swelling, mass and lump, neck: Secondary | ICD-10-CM | POA: Diagnosis not present

## 2023-01-24 DIAGNOSIS — G9389 Other specified disorders of brain: Secondary | ICD-10-CM

## 2023-01-24 DIAGNOSIS — G43109 Migraine with aura, not intractable, without status migrainosus: Secondary | ICD-10-CM | POA: Diagnosis not present

## 2023-01-24 DIAGNOSIS — G4440 Drug-induced headache, not elsewhere classified, not intractable: Secondary | ICD-10-CM

## 2023-01-24 MED ORDER — TOPIRAMATE 25 MG PO TABS: 25.0000 mg | ORAL_TABLET | Freq: Every day | ORAL | 11 refills | Status: DC

## 2023-01-24 NOTE — Patient Instructions (Signed)
Plan: -Migraine prevention: Switch to Topamax 25 mg qhs. Stop nortriptyline. -Migraine rescue:  Nurtec 75 mg PRN, Zofran for nausea -Limit use of pain relievers to no more than 2 days out of week to prevent risk of rebound or medication-overuse headache. -Keep headache diary  Return to clinic in 3 months

## 2023-02-08 ENCOUNTER — Telehealth: Payer: Self-pay | Admitting: Neurology

## 2023-02-08 NOTE — Telephone Encounter (Signed)
Called and talked to pt Topax  is making her headaches and restless legs worst, She was okay to wait on Dr. Berdine Addison on Monday. Wants to see if there is anything else to take.

## 2023-02-08 NOTE — Telephone Encounter (Signed)
Pt called in stating the Topiramate is giving her headaches and restless leg. She has decided to stop taking it. She says she is super sensitive to medicine.

## 2023-02-11 ENCOUNTER — Telehealth: Payer: Self-pay

## 2023-02-11 DIAGNOSIS — G43109 Migraine with aura, not intractable, without status migrainosus: Secondary | ICD-10-CM

## 2023-02-11 MED ORDER — AIMOVIG 140 MG/ML ~~LOC~~ SOAJ: 140.0000 mg | SUBCUTANEOUS | 11 refills | Status: DC

## 2023-02-11 NOTE — Telephone Encounter (Signed)
Patient returned phone call. °

## 2023-02-11 NOTE — Telephone Encounter (Signed)
Called and talked to pt she is taking Topax making her headaches and restless legs worst, She was okay to wait on Dr. Berdine Addison on Monday. Wants to see if there is anything else to take.

## 2023-02-11 NOTE — Telephone Encounter (Signed)
Returned patient's call. Topamax did not help headaches (made them worse). She has not be able to tolerate topamax, propranolol, depakote, gabapentin, cymbalta.   She is on Nurtec but still having to take ~ 9 doses per month for migraine.  I will order Aimovig for migraine prevention and begin the approval process for this.  All questions were answered.  Kai Levins, MD [facility]

## 2023-02-11 NOTE — Telephone Encounter (Signed)
Called and left message to call back.

## 2023-02-12 ENCOUNTER — Telehealth: Payer: Self-pay

## 2023-02-12 NOTE — Telephone Encounter (Signed)
Sent to PA team

## 2023-03-05 ENCOUNTER — Telehealth: Payer: Self-pay

## 2023-03-07 ENCOUNTER — Telehealth: Payer: Self-pay | Admitting: Pharmacy Technician

## 2023-03-07 ENCOUNTER — Other Ambulatory Visit (HOSPITAL_COMMUNITY): Payer: Self-pay

## 2023-03-07 NOTE — Telephone Encounter (Signed)
PA has been submitted EXPEDITED, and telephone encounter has been created.

## 2023-03-07 NOTE — Telephone Encounter (Signed)
Patient Advocate Encounter  Received notification from [facility] that prior authorization for AIMOVIG 140MG  is required.   PA submitted on 4.11.24 Key [id number] Status is pending

## 2023-03-08 NOTE — Telephone Encounter (Signed)
Additional questions received and submitted. Pending determination

## 2023-03-12 NOTE — Telephone Encounter (Signed)
Denise Foley, CPhT      03/08/23 11:09 AM Note Additional questions received and submitted. Pending determination

## 2023-03-15 NOTE — Telephone Encounter (Signed)
Please see denial reasoning

## 2023-03-15 NOTE — Telephone Encounter (Signed)
Pharmacy Patient Advocate Encounter  Received notification from [employer] that the request for prior authorization for AIMOVIG  has been denied due to   CHART NOTES SAYS (2) PER MONTH   Please be advised we currently do not have a Pharmacist to review denials, therefore you will need to process appeals accordingly as needed. Thanks for your support at this time.   You may call [phone] or fax [phone], to appeal.

## 2023-03-18 NOTE — Telephone Encounter (Signed)
Called [employer] to appeal Aimovig. Dr. Loleta Chance needs to send new notes. They do not except note from phone message. Will fax new notes to [phone]

## 2023-03-19 ENCOUNTER — Telehealth: Payer: Self-pay | Admitting: Anesthesiology

## 2023-03-19 ENCOUNTER — Other Ambulatory Visit (HOSPITAL_COMMUNITY): Payer: Self-pay

## 2023-03-19 ENCOUNTER — Telehealth: Payer: Self-pay

## 2023-03-19 NOTE — Telephone Encounter (Signed)
Elwyn Lade from [employer] Dept left message with AN stating she has questions regarding pt's appeal information. Call back number is [phone].

## 2023-03-19 NOTE — Telephone Encounter (Signed)
Patient Advocate Encounter  Appeal for AIMOVIG  has been approved with [employer].    Per [facility] test claim, copay for 30 days supply is $45  PA# [id number] Effective dates: 4.12.24 through 4.23.25

## 2023-03-19 NOTE — Telephone Encounter (Signed)
F/u   [facility] Dept asking for a call back - denial for 4 or more migraine a month  Erenumab-aooe (AIMOVIG) 140 MG/ML SOAJ .  Fax # [phone].    Call back  [phone]

## 2023-03-19 NOTE — Telephone Encounter (Signed)
Faxed addended notes Per Dr. Hill to [facility]. Called and left message on pt voice mail.

## 2023-03-20 NOTE — Telephone Encounter (Signed)
Called and let pt know about meds approval.

## 2023-03-25 ENCOUNTER — Other Ambulatory Visit: Payer: Self-pay | Admitting: Neurology

## 2023-03-25 DIAGNOSIS — G43109 Migraine with aura, not intractable, without status migrainosus: Secondary | ICD-10-CM

## 2023-05-03 NOTE — Progress Notes (Signed)
NEUROLOGY FOLLOW UP OFFICE NOTE  Oberia Colicchio [id number]  Subjective:  Joliana Ahola is a 62 y.o. year old right-handed female with a medical history of Addison's disease, CKD, HLD, hypothyroidism, migraines, arthritis who we last saw on 01/24/23.  To briefly review: Patient has had migraines since childhood. She mentions her migraines were worse in [place] and stress from prior marriage. They have been better since moving to [place] in 2018. The headaches are mostly over the right temple. It is a pulsating sensation and pounding with an occasional ice pick like sensation. She mentions weather changes are a trigger. Her average headache 7/10. She gets blurry vision, flashing lights prior to her migraines. She endorses photophobia and phonophobia and nausea. When she was still menstruating, the migraines were worse at that time and would cause vomiting and passing out. She may also have drooping of eyes, but no focal numbness or weakness with her headaches. The headaches are not positional. She may know it is coming a day prior to her headache. When she has a headache, it will usually last a day with a residual headache the next day. She has 1-2 migraines per month.    She has another type of headache. The headache is also located on the left temple, occasionally across the forehead. It is a pressure sensation, 3-4/10. It is basically a background constant headache between her migraines.    She has occasional neck pain. She mentions she has a lot of pain, but deals with it.   She has a strong family history of migraines. She drinks one cup of green tea per day. No EtOH or tobacco use.   Patient does not sleep well. She states she does not get much sleep, maybe anxiety driven by her report. She has had multiple sleep studies before and told she does not have OSA but "does not get any REM sleep" per her report. She endorses daytime sleepiness and waking not feeling well rested.   Patient is currently taking  just extra strength tylenol for her headaches. She takes this 4-5 times per week.   Patient has tried multiple medications in the past.  Patient was on Sumatriptan and had left sided numbness and felt weird. She went to the ED and felt she may have had a TIA and told to stop sumatriptan.   Per [facility] note from 03/13/18: Did not tolerate Topamax, Propranolol, Depakote, Gabapentin, Lyrica, Cymbalta, Imitrex, and Maxalt.  She was also given trigger point injections. This helped some. She got Botox injections but in her chest, not her head.  She has had neck pain since her late thirties. Currently it is midline and radiates to her trapezius muscles, between her shoulder blades and to the base of her skull.  Butterbur extract was working at that time but then stopped working.   Tried Nurtec sample in 03/2022 (2 tablets) worked a little (took the edge off). Tried Ubrelvy sample in 05/2022 which she is not sure of benefit. Tried Aimovig (1 shot) - unclear benefit   Vitamins: Vit D, B12 1000 mcg daily.   She has not tried amitriptyline, nortriptyline, venlafaxine, or lamotrigine.   Patient mentions a history of a neck mass and removal of a pituitary macroadenoma as well.  01/24/23: MRI brain was similar to prior. MRI cervical spine was unremarkable. TSH was < 0.01. I recommended she contact her PCP or endocrinologist about this. Her ESR and CRP were normal.   Patient was having racing heart since starting Nortriptyline. Patient  was not sure if it was related to nightly xanax, so she stopped the xanax. Then she had swelling and twitching of tongue. She does have a history of angioedema. She stopped the Nortriptyline after this. The Nortriptyline was only taken for 12 days.   She has had one migraine since her last appointment. Nurtec has really helped. She still has a background daily headache.  Most recent Assessment and Plan (01/24/23): This is Adanya Kittrell, a 62 y.o. female with migraine with aura.  She currently has 1-2 migraines per month that respond well to Nurtec, but also has a daily background headache. She was not able to tolerate Nortriptyline due to tongue swelling. I will start Topamax today to try to prevent daily headaches. 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   Plan: -Migraine prevention: Switch to Topamax 25 mg qhs -Migraine rescue:  Nurtec 75 mg PRN, Zofran for nausea -Limit use of pain relievers to no more than 2 days out of week to prevent risk of rebound or medication-overuse headache. -Keep headache diary  Since their last visit: Patient was not able to tolerate Topamax (made headaches worse, caused restless legs). She had previously not been able to tolerate  propranolol, depakote, gabapentin, cymbalta. She was on Nurtec but still having to take ~ 9 doses per month for migraine. I stopped Topamax and ordered Aimovig for migraine prevention on 02/11/23. Medication was approved on 03/19/23. The medication was $150 per month, so patient has not picked it up.  Patient has about 6 headaches per month. She is taking Nurtec. This will work for milder headaches. She does not know of anything that helps massive headaches, but she has not had one of these since starting Nurtec.  Patient has neck pain and tightness of shoulders as well. She has no other complaints today.  MEDICATIONS:  Outpatient Encounter Medications as of 05/10/2023  Medication Sig   alendronate (FOSAMAX) 70 MG tablet Take 70 mg by mouth once a week. Take with a full glass of water on an empty stomach. (Patient not taking: Reported on 01/24/2023)   ALPRAZolam (XANAX) 0.25 MG tablet Take 0.25 mg by mouth at bedtime as needed for anxiety.   aspirin EC 81 MG tablet  Take 81 mg by mouth daily. Swallow whole.   Calcium Carbonate-Vitamin D ([employer] CALCIUM/VIT D SOFT CHEWS PO) Take 5,000 capsules by mouth every 30 (thirty) days.   clotrimazole-betamethasone (LOTRISONE) cream Apply 1 application topically as needed (fungus).   cyclobenzaprine (FLEXERIL) 10 MG tablet Take 10 mg by mouth at bedtime.   Erenumab-aooe (AIMOVIG) 140 MG/ML SOAJ INJECT 140 MG INTO THE SKIN EVERY 28 DAYS   FLUDROCORTISONE ACETATE PO Take 0.1 mg by mouth daily. Take 1/2 tablet daily   hydrocortisone (CORTEF) 5 MG tablet Take 5 mg by mouth 2 (two) times daily.   lansoprazole (PREVACID) 15 MG capsule Take 15 mg by mouth daily at 12 noon.   levothyroxine (SYNTHROID) 88 MCG tablet Take 88 mcg by mouth daily before breakfast.   liothyronine (CYTOMEL) 5 MCG tablet Take 5 mcg by mouth daily.   Methylcobalamin (B12-ACTIVE) 1 MG CHEW Chew 1 m by mouth daily.   Multiple Vitamin (MULTIVITAMIN) tablet Take 1 tablet by mouth daily.   nystatin cream (MYCOSTATIN) Apply 1  application topically 2 (two) times daily.   ondansetron (ZOFRAN) 4 MG tablet Take 4 mg by mouth every 8 (eight) hours as needed for nausea or vomiting.   pravastatin (PRAVACHOL) 20 MG tablet Take 1 tablet (20 mg total) by mouth every evening.   Rimegepant Sulfate (NURTEC) 75 MG TBDP Take 1 tablet (75 mg total) by mouth as needed.   Vitamin D, Ergocalciferol, (DRISDOL) 1.25 MG (50000 UNIT) CAPS capsule Take 50,000 Units by mouth every 7 (seven) days.   No facility-administered encounter medications on file as of 05/10/2023.    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   Osteoporosis    Pott's disease     PAST SURGICAL HISTORY: Past Surgical History:  Procedure Laterality Date   ABDOMINAL HYSTERECTOMY  1993    ADRENALECTOMY Bilateral 2002   CHOLECYSTECTOMY  2003   FRACTURE SURGERY Left 2005   ankle   PAROTIDECTOMY  1993   PVC ABLATION  2012   TONSILLECTOMY  1977   TUBAL LIGATION  1989   TUMOR EXCISION  2000   pituitary    ALLERGIES: Allergies  Allergen Reactions   Nortriptyline Hcl Other (See Comments)    angioedema   Cymbalta [Duloxetine Hcl]    Depakote [Divalproex Sodium]    Lamictal [Lamotrigine]    Lyrica [Pregabalin]    Sumatriptan    Tegretol [Carbamazepine]    Toprol Xl [Metoprolol]    Trazodone And Nefazodone    Wellbutrin [Bupropion]    Zonisamide    Tape Rash    FAMILY HISTORY: Family History  Problem Relation Age of Onset   Thyroid disease Mother    Lung cancer Mother    Liver cancer Mother    Congestive Heart Failure Father    Stroke Maternal Grandmother     SOCIAL HISTORY: Social History   Tobacco Use   Smoking status: Former    Types: Cigarettes   Smokeless tobacco: Never  Vaping Use   Vaping Use: Never used  Substance Use Topics   Alcohol use: Never   Drug use: Never   Social History   Social History Narrative   Are you right handed or left handed? Right   Are you currently employed ? no   What is your current occupation?   Do you live at home alone? daughter   Who lives with you?    What type of home do you live in: 1 story or 2 story? two    Caffeine 1 cup daily      Objective:  Vital Signs:  BP 123/84   Pulse 95   Ht 5\' 4"  (1.626 m)   Wt 141 lb (64 kg)   SpO2 98%   BMI 24.20 kg/m   General: No acute distress.  Patient appears well-groomed.   Head:  Normocephalic/atraumatic Neck: supple, reduced range of motion. Tightness of bilateral trapezius. Neurological Exam: alert and oriented.  Speech fluent and not dysarthric, language intact.  CN II-XII intact. Bulk and tone normal, muscle strength 5/5 throughout.  Sensation to light touch intact.  Deep tendon reflexes 2+ throughout, toes downgoing.  Finger to nose testing intact.   Gait normal, Romberg negative.   Labs and Imaging review: No new results  Previously reviewed results: 12/14/22: TSH: < 0.01 ESR:  19 CRP: < 1.0   CMP (06/29/22) significant for Cr 1.66   External labs: PTH (05/04/22) wnl Free T3 (05/04/22): elevated to 4.47 Free T4 (05/04/22): 1.1 Vit D (09/05/21): 72 HbA1c (12/02/2018): 5.4   Imaging: MRI brain w/wo contrast (01/06/23): FINDINGS: Brain: There is no evidence of acute infarct, hemorrhage, midline shift or extra-axial fluid collection. The ventricles and sulci are normal. A few punctate foci of T2 hyperintensity in the cerebral white matter are nonspecific and within normal limits for age.   Dedicated imaging was performed through the sella turcica. Sequelae of transsphenoidal pituitary surgery are identified. A small amount of enhancing soft tissue along the sellar floor bilaterally likely reflects normal residual pituitary, with the infundibulum inserting into this tissue to the left of midline. There is a 6 mm focus of enhancing soft tissue extending from the ventral margin of the sella into the superior aspect of the sphenoid sinus on the left which is partially inseparable from and demonstrates similar signal characteristics to residual pituitary tissue. The optic chiasm and cavernous sinuses are unremarkable.   Vascular: Major intracranial vascular flow voids are preserved.   Skull and upper cervical spine: No suspicious marrow lesion.   Sinuses/Orbits: Unremarkable orbits. Postoperative changes in the sphenoid sinuses. Minimal mucosal thickening in the right maxillary sinus. Clear mastoid air cells.   Other: In the carotid space of the right upper neck medial to the internal jugular vein and just below the jugular foramen is an elongated mass measuring 8 x 9 x 29 mm (AP x transverse x craniocaudal). The majority of the mass is T2 hyperintense with suppression on FLAIR imaging, however the central portion of the mass is  T2 hypointense and enhancing. This mass was also described in multiple prior outside MRI reports, and the portion of the mass included on the 2022 CT is fluid density.   IMPRESSION: 1. Postoperative changes from transsphenoidal pituitary surgery. 6 mm focus of enhancing soft tissue extending from the ventral margin of the sella into the superior aspect of the sphenoid sinus on the left could reflect postoperative granulation tissue or a small amount of residual tumor. 2. Otherwise unremarkable appearance of the brain for age. 3. 3 cm mass just below the jugular foramen in the carotid space of the right upper neck, longstanding based on prior outside MRI reports and benign in appearance, possibly a schwannoma or cyst with traversing neural elements.   MRI cervical spine w/wo contrast (01/06/23): FINDINGS: Alignment: Trace retrolisthesis of C3 on C4 and C4 on C5.   Vertebrae: No fracture or suspicious marrow lesion. Modic type 1 endplate changes at C3-4.   Cord: Normal cord signal and morphology. No abnormal intradural enhancement.   Posterior Fossa, vertebral arteries, paraspinal tissues: The posterior fossa and the right carotid space mass in the upper neck are more fully evaluated on the contemporaneous head MRI. Preserved vertebral artery flow voids.   Disc levels:   Mild-to-moderate disc space narrowing from C3-4 through C6-7.   C2-3: Negative.   C3-4: Disc bulging and mild uncovertebral spurring without significant stenosis.   C4-5: Broad-based posterior disc osteophyte complex results in mild right neural foraminal stenosis without spinal stenosis.   C5-6: Disc bulging and uncovertebral spurring result in mild right neural foraminal stenosis without spinal stenosis.   C6-7: Disc bulging and uncovertebral spurring without significant stenosis.   C7-T1: Mild-to-moderate facet arthrosis without disc herniation or stenosis.   IMPRESSION: 1. Cervical disc and facet  degeneration with mild right neural foraminal stenosis  at C4-5 and C5-6. No spinal stenosis. 2. Right upper neck mass reported on today's separate head MRI.  CTH (04/21/21): Report only: Mild sinus disease, otherwise unremarkable   MRI brain wo contrast (external, report only - 05/22/16): Indication: left face droop, concern for stroke.   Impression: No evidence of acute intracranial process.   MRI brain w/wo contrast (external, report only - 03/09/2016):  Indication: Headaches, problems with left eye, neck mass seen on prior MRI. Intracranial mass. Pituitary macroadenoma.   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  Assessment/Plan:  This is Wilson Groot, a 62 y.o. female with episodic migraine with aura. She currently has 6 migraines per month, responding to Nurtec. Patient was not able to tolerate notriptyline or topamax. She has Aimovig approved since 02/2023 but has not picked it up due to cost. There has been improvement in intensity and some improvement in frequency (9 migraines per month previously).   Plan: Migraine prevention:  I will try to get Aimovig cheaper or will switch Aimovig to a more affordable medication. I will resend to PA team for a tier exception (patient has Medicare part D and is not eligible for copay card). Migraine rescue:  Nurtec 75 mg as needed, Zofran for nausea Limit use of pain relievers to no more than 2 days out of week to prevent risk of rebound or medication-overuse headache. Keep headache diary Physical therapy for neck pain  ([place])  Return to clinic in 6 months  Total time spent reviewing records, interview, history/exam, documentation, and coordination of care on day of encounter:  35 min  Jacquelyne Balint, MD

## 2023-05-10 ENCOUNTER — Encounter: Payer: Self-pay | Admitting: Neurology

## 2023-05-10 ENCOUNTER — Ambulatory Visit: Payer: Medicare Other | Admitting: Neurology

## 2023-05-10 VITALS — BP 123/84 | HR 95 | Ht 64.0 in | Wt 141.0 lb

## 2023-05-10 DIAGNOSIS — M542 Cervicalgia: Secondary | ICD-10-CM

## 2023-05-10 NOTE — Patient Instructions (Addendum)
Migraine prevention:  We will switch your Aimovig to a more affordable, similar medication. I will research this and get back to you. Migraine rescue:  Nurtec 75 mg as needed, Zofran for nausea Limit use of pain relievers to no more than 2 days out of week to prevent risk of rebound or medication-overuse headache. Keep headache diary Physical therapy for neck pain ([place])  Return to clinic in 6 months  If you have problems getting your medications, please let me know.  The physicians and staff at [facility] are committed to providing excellent care. You may receive a survey requesting feedback about your experience at our office. We strive to receive "very good" responses to the survey questions. If you feel that your experience would prevent you from giving the office a "very good " response, please contact our office to try to remedy the situation. We may be reached at [phone]. Thank you for taking the time out of your busy day to complete the survey.  Jacquelyne Balint, MD [facility]

## 2023-05-14 DIAGNOSIS — N1832 Chronic kidney disease, stage 3b: Secondary | ICD-10-CM | POA: Diagnosis not present

## 2023-05-17 DIAGNOSIS — I129 Hypertensive chronic kidney disease with stage 1 through stage 4 chronic kidney disease, or unspecified chronic kidney disease: Secondary | ICD-10-CM | POA: Diagnosis not present

## 2023-05-17 DIAGNOSIS — E2740 Unspecified adrenocortical insufficiency: Secondary | ICD-10-CM | POA: Diagnosis not present

## 2023-05-17 DIAGNOSIS — E896 Postprocedural adrenocortical (-medullary) hypofunction: Secondary | ICD-10-CM | POA: Diagnosis not present

## 2023-05-17 DIAGNOSIS — N1832 Chronic kidney disease, stage 3b: Secondary | ICD-10-CM | POA: Diagnosis not present

## 2023-06-04 DIAGNOSIS — M2560 Stiffness of unspecified joint, not elsewhere classified: Secondary | ICD-10-CM | POA: Diagnosis not present

## 2023-06-04 DIAGNOSIS — G4486 Cervicogenic headache: Secondary | ICD-10-CM | POA: Diagnosis not present

## 2023-06-11 DIAGNOSIS — E039 Hypothyroidism, unspecified: Secondary | ICD-10-CM | POA: Diagnosis not present

## 2023-06-11 DIAGNOSIS — T783XXD Angioneurotic edema, subsequent encounter: Secondary | ICD-10-CM | POA: Diagnosis not present

## 2023-06-11 DIAGNOSIS — G43909 Migraine, unspecified, not intractable, without status migrainosus: Secondary | ICD-10-CM | POA: Diagnosis not present

## 2023-06-11 DIAGNOSIS — Z008 Encounter for other general examination: Secondary | ICD-10-CM | POA: Diagnosis not present

## 2023-06-11 DIAGNOSIS — R32 Unspecified urinary incontinence: Secondary | ICD-10-CM | POA: Diagnosis not present

## 2023-06-11 DIAGNOSIS — K219 Gastro-esophageal reflux disease without esophagitis: Secondary | ICD-10-CM | POA: Diagnosis not present

## 2023-06-11 DIAGNOSIS — D6851 Activated protein C resistance: Secondary | ICD-10-CM | POA: Diagnosis not present

## 2023-06-11 DIAGNOSIS — E785 Hyperlipidemia, unspecified: Secondary | ICD-10-CM | POA: Diagnosis not present

## 2023-06-11 DIAGNOSIS — E2740 Unspecified adrenocortical insufficiency: Secondary | ICD-10-CM | POA: Diagnosis not present

## 2023-06-24 DIAGNOSIS — E038 Other specified hypothyroidism: Secondary | ICD-10-CM | POA: Diagnosis not present

## 2023-06-24 DIAGNOSIS — E559 Vitamin D deficiency, unspecified: Secondary | ICD-10-CM | POA: Diagnosis not present

## 2023-06-24 DIAGNOSIS — E2740 Unspecified adrenocortical insufficiency: Secondary | ICD-10-CM | POA: Diagnosis not present

## 2023-06-24 DIAGNOSIS — M810 Age-related osteoporosis without current pathological fracture: Secondary | ICD-10-CM | POA: Diagnosis not present

## 2023-07-12 ENCOUNTER — Other Ambulatory Visit: Payer: Self-pay | Admitting: Cardiology

## 2023-07-12 DIAGNOSIS — N1832 Chronic kidney disease, stage 3b: Secondary | ICD-10-CM

## 2023-07-12 DIAGNOSIS — I493 Ventricular premature depolarization: Secondary | ICD-10-CM

## 2023-07-12 DIAGNOSIS — I951 Orthostatic hypotension: Secondary | ICD-10-CM

## 2023-08-22 NOTE — Progress Notes (Deleted)
Cardiology Office Note:    Date:  08/22/2023   ID:  Denise Foley, DOB 11-05-1961, MRN [id number]  PCP:  Doran Stabler, NP  Cardiologist:  Norman Herrlich, MD    Referring MD: Mikki Santee Key, *    ASSESSMENT:    No diagnosis found. PLAN:    In order of problems listed above:  ***   Next appointment: ***   Medication Adjustments/Labs and Tests Ordered: Current medicines are reviewed at length with the patient today.  Concerns regarding medicines are outlined above.  No orders of the defined types were placed in this encounter.  No orders of the defined types were placed in this encounter.    History of Present Illness:    Denise Foley is a 62 y.o. female with a hx of dysautonomia and syncope adrenal insufficiency hypothyroidism osteoporosis frequent PVCs and EP Catheter ablation in 2012 stage III CKD last seen 06/29/2022. 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      EKGs/Labs/Other Studies Reviewed:    The following studies were reviewed today:  Cardiac Studies & Procedures       ECHOCARDIOGRAM  ECHOCARDIOGRAM COMPLETE 06/19/2021  Narrative ECHOCARDIOGRAM REPORT    Patient Name:   Denise Foley Date of Exam: 06/19/2021 Medical Rec #:  [id number]  Height:       64.0 in Accession #:    [id number] Weight:       146.0 lb Date of Birth:  1961-03-05  BSA:          1.711 m Patient Age:    60 years   BP:           105/68 mmHg Patient Gender: F          HR:           65 bpm. Exam Location:  [facility]  Procedure: 2D  Echo  Indications:    PVC's (premature ventricular contractions) [I49.3 (ICD-10-CM)]; Orthostatic hypotension [I95.1 (ICD-10-CM)]; Stage 3b chronic kidney disease (HCC) [Q65.78 (ICD-10-CM)]; Chest pain of uncertain etiology [R07.9 (ICD-10-CM)]  History:        Patient has prior history of Echocardiogram examinations, most recent 08/29/2011.  Sonographer:    Louie Boston Referring Phys: [phone] Denise Foley J Randle Shatzer  IMPRESSIONS   1. Left ventricular ejection fraction, by estimation, is 60 to 65%. The left ventricle has normal function. The left ventricle has no regional wall motion abnormalities. Left ventricular diastolic parameters were normal. 2. Right ventricular systolic function is normal. The right ventricular size is normal. There is normal pulmonary artery systolic pressure. 3. The mitral valve is normal in structure. Mild mitral valve regurgitation. No evidence of mitral stenosis. 4. The aortic valve is normal in structure. Aortic valve regurgitation is not visualized. No aortic stenosis is present. 5. The inferior vena cava is normal in size with greater than 50% respiratory variability, suggesting right atrial pressure of 3 mmHg.  FINDINGS Left Ventricle: Left ventricular ejection fraction, by estimation, is 60 to 65%. The left ventricle has normal function. The left ventricle has no  regional wall motion abnormalities. The left ventricular internal cavity size was normal in size. There is no left ventricular hypertrophy. Left ventricular diastolic parameters were normal.  Right Ventricle: The right ventricular size is normal. No increase in right ventricular wall thickness. Right ventricular systolic function is normal. There is normal pulmonary artery systolic pressure. The tricuspid regurgitant velocity is 1.78 m/s, and with an assumed right atrial pressure of 3 mmHg, the estimated right ventricular systolic pressure is 15.7 mmHg.  Left Atrium: Left atrial size was normal in  size.  Right Atrium: Right atrial size was normal in size.  Pericardium: There is no evidence of pericardial effusion.  Mitral Valve: The mitral valve is normal in structure. Mild mitral valve regurgitation. No evidence of mitral valve stenosis.  Tricuspid Valve: The tricuspid valve is normal in structure. Tricuspid valve regurgitation is mild . No evidence of tricuspid stenosis.  Aortic Valve: The aortic valve is normal in structure. Aortic valve regurgitation is not visualized. No aortic stenosis is present.  Pulmonic Valve: The pulmonic valve was normal in structure. Pulmonic valve regurgitation is not visualized. No evidence of pulmonic stenosis.  Aorta: The aortic root is normal in size and structure.  Venous: The inferior vena cava is normal in size with greater than 50% respiratory variability, suggesting right atrial pressure of 3 mmHg.  IAS/Shunts: No atrial level shunt detected by color flow Doppler.   LEFT VENTRICLE PLAX 2D LVIDd:         4.10 cm  Diastology LVIDs:         2.40 cm  LV e' medial:    5.77 cm/s LV PW:         0.90 cm  LV E/e' medial:  14.3 LV IVS:        0.90 cm  LV e' lateral:   11.20 cm/s LVOT diam:     1.90 cm  LV E/e' lateral: 7.4 LV SV:         50 LV SV Index:   29 LVOT Area:     2.84 cm   RIGHT VENTRICLE            IVC RV S prime:     8.81 cm/s  IVC diam: 1.50 cm TAPSE (M-mode): 1.6 cm  LEFT ATRIUM             Index       RIGHT ATRIUM           Index LA diam:        3.30 cm 1.93 cm/m  RA Area:     10.10 cm LA Vol (A2C):   37.4 ml 21.85 ml/m RA Volume:   17.60 ml  10.28 ml/m LA Vol (A4C):   21.0 ml 12.27 ml/m LA Biplane Vol: 30.8 ml 18.00 ml/m AORTIC VALVE LVOT Vmax:   86.20 cm/s LVOT Vmean:  58.600 cm/s LVOT VTI:    0.175 m  AORTA Ao Root diam: 2.90 cm Ao Asc diam:  3.00 cm Ao Desc diam: 1.80 cm  MITRAL VALVE               TRICUSPID VALVE MV Area (PHT): 3.50 cm    TR Peak grad:   12.7 mmHg MV Decel Time: 217 msec    TR Vmax:         178.00 cm/s MV E velocity: 82.70 cm/s MV A velocity: 84.00 cm/s  SHUNTS MV E/A ratio:  0.98        Systemic VTI:  0.18 m Systemic  Diam: 1.90 cm  Gypsy Balsam MD Electronically signed by Gypsy Balsam MD Signature Date/Time: 06/19/2021/12:56:39 PM    Final     CT SCANS  CT CORONARY MORPH W/CTA COR W/SCORE 06/12/2021  Addendum 06/12/2021  4:33 PM ADDENDUM REPORT: 06/12/2021 16:30  CLINICAL DATA:  74F with chest pain  EXAM: Cardiac/Coronary CTA  TECHNIQUE: The patient was scanned on a [employer].  FINDINGS: A 100 kV prospective scan was triggered in the descending thoracic aorta at 111 HU's. Axial non-contrast 3 mm slices were carried out through the heart. The data set was analyzed on a dedicated work station and scored using the Agatson method. Gantry rotation speed was 250 msecs and collimation was .6 mm. 0.8 mg of sl NTG was given. The 3D data set was reconstructed in 5% intervals of the 35-75 % of the R-R cycle. Phases were analyzed on a dedicated work station using MPR, MIP and VRT modes. The patient received 80 cc of contrast.  Coronary Arteries:  Normal coronary origin.  Right dominance.  RCA is a large dominant artery that gives rise to PDA and PLA. There is noncalcified plaque in the proximal RCA causing 25-49% stenosis  Left main is a large artery that gives rise to LAD and LCX arteries.  LAD is a large vessel that has no plaque.  LCX is a non-dominant artery that gives rise to one large OM1 branch. There is no plaque.  Other findings:  Left Ventricle: Normal size  Left Atrium: Normal size  Pulmonary Veins: Normal configuration  Right Ventricle: Normal size  Right Atrium: Normal size  Cardiac valves: Mild AV calcifications  Thoracic aorta: Normal size  Pulmonary Arteries: Normal size  Systemic Veins: Normal drainage  Pericardium: Normal thickness  IMPRESSION: 1. Coronary calcium score of 0.  2. Normal coronary  origin with right dominance.  3. 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  CAD-RADS 2. Mild non-obstructive CAD (25-49%). Consider non-atherosclerotic causes of chest pain. Consider preventive therapy and risk factor modification.   Electronically Signed By: Epifanio Lesches MD On: 06/12/2021 16:30  Narrative EXAM: OVER-READ INTERPRETATION  CT CHEST  The following report is an over-read performed by radiologist Dr. Trudie Reed of [facility] on 06/12/2021. This over-read does not include interpretation of cardiac or coronary anatomy or pathology. The coronary calcium score/coronary CTA interpretation by the cardiologist is attached.  COMPARISON:  None.  FINDINGS: Within the visualized portions of the thorax there are no suspicious appearing pulmonary nodules or masses, there is no acute consolidative airspace disease, no pleural effusions, no pneumothorax and no lymphadenopathy. Visualized portions of the upper abdomen are unremarkable. There are no aggressive appearing lytic or blastic lesions noted in the visualized portions of the skeleton.  IMPRESSION: No significant incidental noncardiac findings are noted.  Electronically Signed: By: Trudie Reed M.D. On: 06/12/2021 14:14              Recent Labs: 12/14/2022: TSH <0.01 Repeated and verified X2.  Recent Lipid Panel    Component Value Date/Time   CHOL 157 06/29/2022 0838   TRIG 103 06/29/2022 0838   HDL 63 06/29/2022 0838   CHOLHDL 2.5 06/29/2022 0838   LDLCALC 75 06/29/2022 0838    Physical Exam:    VS:  There were no vitals taken for this visit.    Wt Readings from Last 3 Encounters:  05/10/23 141 lb (64 kg)  01/24/23 144 lb (65.3 kg)  12/14/22 142 lb (  64.4 kg)     GEN: *** Well nourished, well developed in no acute distress HEENT: Normal NECK: No JVD; No carotid bruits LYMPHATICS: No lymphadenopathy CARDIAC:  ***RRR, no murmurs, rubs, gallops RESPIRATORY:  Clear to auscultation without rales, wheezing or rhonchi  ABDOMEN: Soft, non-tender, non-distended MUSCULOSKELETAL:  No edema; No deformity  SKIN: Warm and dry NEUROLOGIC:  Alert and oriented x 3 PSYCHIATRIC:  Normal affect    Signed, Norman Herrlich, MD  08/22/2023 1:52 PM    [facility]

## 2023-08-23 ENCOUNTER — Ambulatory Visit: Payer: Medicare Other | Admitting: Cardiology

## 2023-08-23 DIAGNOSIS — I951 Orthostatic hypotension: Secondary | ICD-10-CM

## 2023-08-23 DIAGNOSIS — N1832 Chronic kidney disease, stage 3b: Secondary | ICD-10-CM

## 2023-08-23 DIAGNOSIS — I493 Ventricular premature depolarization: Secondary | ICD-10-CM

## 2023-08-23 DIAGNOSIS — E271 Primary adrenocortical insufficiency: Secondary | ICD-10-CM

## 2023-08-23 DIAGNOSIS — G909 Disorder of the autonomic nervous system, unspecified: Secondary | ICD-10-CM

## 2023-09-17 DIAGNOSIS — R21 Rash and other nonspecific skin eruption: Secondary | ICD-10-CM | POA: Diagnosis not present

## 2023-09-17 DIAGNOSIS — L539 Erythematous condition, unspecified: Secondary | ICD-10-CM | POA: Diagnosis not present

## 2023-09-17 DIAGNOSIS — L299 Pruritus, unspecified: Secondary | ICD-10-CM | POA: Diagnosis not present

## 2023-10-02 ENCOUNTER — Other Ambulatory Visit: Payer: Self-pay

## 2023-10-03 ENCOUNTER — Ambulatory Visit: Payer: Medicare Other | Attending: Cardiology | Admitting: Cardiology

## 2023-10-03 ENCOUNTER — Encounter: Payer: Self-pay | Admitting: Cardiology

## 2023-10-03 VITALS — BP 130/70 | HR 74 | Ht 64.0 in | Wt 141.4 lb

## 2023-10-03 DIAGNOSIS — E271 Primary adrenocortical insufficiency: Secondary | ICD-10-CM

## 2023-10-03 DIAGNOSIS — I951 Orthostatic hypotension: Secondary | ICD-10-CM | POA: Diagnosis not present

## 2023-10-03 DIAGNOSIS — N1832 Chronic kidney disease, stage 3b: Secondary | ICD-10-CM

## 2023-10-03 DIAGNOSIS — R030 Elevated blood-pressure reading, without diagnosis of hypertension: Secondary | ICD-10-CM

## 2023-10-03 DIAGNOSIS — I493 Ventricular premature depolarization: Secondary | ICD-10-CM | POA: Diagnosis not present

## 2023-10-03 NOTE — Patient Instructions (Addendum)
Medication Instructions:  Your physician recommends that you continue on your current medications as directed. Please refer to the Current Medication list given to you today.  *If you need a refill on your cardiac medications before your next appointment, please call your pharmacy*   Lab Work: Your physician recommends that you return for lab work in:   Labs today: CMP, Lipids  If you have labs (blood work) drawn today and your tests are completely normal, you will receive your results only by: MyChart Message (if you have MyChart) OR A paper copy in the mail If you have any lab test that is abnormal or we need to change your treatment, we will call you to review the results.   Testing/Procedures: None   Follow-Up: At [facility], you and your health needs are our priority.  As part of our continuing mission to provide you with exceptional heart care, we have created designated Provider Care Teams.  These Care Teams include your primary Cardiologist (physician) and Advanced Practice Providers (APPs -  Physician Assistants and Nurse Practitioners) who all work together to provide you with the care you need, when you need it.  We recommend signing up for the patient portal called "MyChart".  Sign up information is provided on this After Visit Summary.  MyChart is used to connect with patients for Virtual Visits (Telemedicine).  Patients are able to view lab/test results, encounter notes, upcoming appointments, etc.  Non-urgent messages can be sent to your provider as well.   To learn more about what you can do with MyChart, go to [email or web address].    Your next appointment:   1 year(s)  Provider:   Norman Herrlich, MD    Other Instructions None     [facility]  Tips to measure your blood pressure correctly  To determine whether you have hypertension, a medical professional will take a blood pressure reading. How you prepare for the test, the position of your arm,  and other factors can change a blood pressure reading by 10% or more. That could be enough to hide high blood pressure, start you on a drug you don't really need, or lead your doctor to incorrectly adjust your medications. National and international guidelines offer specific instructions for measuring blood pressure. If a doctor, nurse, or medical assistant isn't doing it right, don't hesitate to ask him or her to get with the guidelines. Here's what you can do to ensure a correct reading:  Don't drink a caffeinated beverage or smoke during the 30 minutes before the test.  Sit quietly for five minutes before the test begins.  During the measurement, sit in a chair with your feet on the floor and your arm supported so your elbow is at about heart level.  The inflatable part of the cuff should completely cover at least 80% of your upper arm, and the cuff should be placed on bare skin, not over a shirt.  Don't talk during the measurement.  Have your blood pressure measured twice, with a brief break in between. If the readings are different by 5 points or more, have it done a third time. There are times to break these rules. If you sometimes feel lightheaded when getting out of bed in the morning or when you stand after sitting, you should have your blood pressure checked while seated and then while standing to see if it falls from one position to the next. Because blood pressure varies throughout the day, your doctor will rarely diagnose hypertension  on the basis of a single reading. Instead, he or she will want to confirm the measurements on at least two occasions, usually within a few weeks of one another. The exception to this rule is if you have a blood pressure reading of 180/110 mm Hg or higher. A result this high usually calls for prompt treatment. It's also a good idea to have your blood pressure measured in both arms at least once, since the reading in one arm (usually the right) may be higher than  that in the left. A 2014 study in The American Journal of Medicine of nearly 3,400 people found average arm- to-arm differences in systolic blood pressure of about 5 points. The higher number should be used to make treatment decisions. In 2017, new guidelines from the American Heart Association, the [employer] of Cardiology, and nine other health organizations lowered the diagnosis of high blood pressure to 130/80 mm Hg or higher for all adults. The guidelines also redefined the various blood pressure categories to now include normal, elevated, Stage 1 hypertension, Stage 2 hypertension, and hypertensive crisis (see "Blood pressure categories"). Blood pressure categories  Blood pressure category SYSTOLIC (upper number)  DIASTOLIC (lower number)  Normal Less than 120 mm Hg and Less than 80 mm Hg  Elevated 120-129 mm Hg and Less than 80 mm Hg  High blood pressure: Stage 1 hypertension 130-139 mm Hg or 80-89 mm Hg  High blood pressure: Stage 2 hypertension 140 mm Hg or higher or 90 mm Hg or higher  Hypertensive crisis (consult your doctor immediately) Higher than 180 mm Hg and/or Higher than 120 mm Hg  Source: American Heart Association and American Stroke Association. For more on getting your blood pressure under control, buy Controlling Your Blood Pressure, a Special Health Report from [facility].

## 2023-10-03 NOTE — Addendum Note (Signed)
Addended by: Roxanne Mins I on: 10/03/2023 01:55 PM   Modules accepted: Orders

## 2023-10-03 NOTE — Progress Notes (Signed)
Cardiology Office Note:    Date:  10/03/2023   ID:  Denise Foley, DOB 02-02-1961, MRN [id number]  PCP:  Doran Stabler, NP  Cardiologist:  Norman Herrlich, MD    Referring MD: Mikki Santee Key, *    ASSESSMENT:    1. Orthostatic hypotension   2. PVC's (premature ventricular contractions)   3. Addison's disease (HCC)   4. Stage 3b chronic kidney disease (HCC)   5. Elevated blood pressure reading    PLAN:    In order of problems listed above:  Doing very well with adrenal replacement asymptomatic managed by endocrine No EKG PVCs today asymptomatic normal EKG Stable with her current adrenal replacement there is no need to adjust if her blood pressure We discussed good technique validated device asked her to continue to trend her blood pressures at home   Next appointment: 1 year   Medication Adjustments/Labs and Tests Ordered: Current medicines are reviewed at length with the patient today.  Concerns regarding medicines are outlined above.  Orders Placed This Encounter  Procedures   EKG 12-Lead   No orders of the defined types were placed in this encounter.    History of Present Illness:    Denise Foley is a 62 y.o. female with a hx of orthostatic hypotension Addison's disease with adrenal hormone replacement PVCs stage IIIb CKD and atherosclerosis of the coronary arteries with noncalcified plaque coronary calcium score of 0 last seen 06/29/2022.  Compliance with diet, lifestyle and medications: Yes  She has done well at times her blood pressure is elevated is been a question of adjusting her adrenal hormone we discussed a validated device good technique I rechecked her blood pressure in the office today 130/80. She has had no palpitations syncope chest pain edema shortness of breath He follows with nephrology. She is overdue for lipids I will do them today and she has no muscle pain or weakness Past Medical History:  Diagnosis Date   Abrasion of left cornea  06/11/2018   Addison's disease (HCC)    Adrenal insufficiency (HCC) 11/07/2012   Formatting of this note might be different from the original.  S/p bilateral adrenalectomy for pituitary cushing's.     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   aspirin EC 81 MG tablet Take 81 mg by mouth daily. Swallow whole.   Calcium Carbonate-Vitamin D ([employer] CALCIUM/VIT D SOFT CHEWS PO) Take 5,000 capsules by mouth every 30 (thirty) days.   clotrimazole-betamethasone (LOTRISONE) cream Apply 1 application topically as needed (fungus).   FLUDROCORTISONE ACETATE PO Take 0.1 mg by mouth daily. Take 1/2 tablet daily   hydrocortisone (CORTEF) 5 MG tablet Take 5 mg by mouth 2 (two) times daily.   lansoprazole (PREVACID) 15 MG capsule Take 15 mg by mouth daily at 12 noon.   levothyroxine (SYNTHROID) 88 MCG tablet Take 88 mcg by mouth daily before breakfast.   liothyronine (CYTOMEL) 5 MCG tablet Take 2.5 mcg by mouth daily.  Methylcobalamin (B12-ACTIVE) 1 MG CHEW Chew 1 m by mouth daily.   Multiple Vitamin (MULTIVITAMIN) tablet Take 1 tablet by mouth daily.   ondansetron (ZOFRAN) 4 MG tablet Take 4 mg by mouth every 8 (eight) hours as needed for nausea or vomiting.   pravastatin (PRAVACHOL) 20 MG tablet TAKE 1 TABLET EVERY EVENING   Rimegepant Sulfate (NURTEC) 75 MG TBDP Take 1 tablet (75 mg total) by mouth as needed. (Patient taking differently: Take 75 mg by mouth as needed (Migraine).)   Vitamin D,  Ergocalciferol, (DRISDOL) 1.25 MG (50000 UNIT) CAPS capsule Take 50,000 Units by mouth every 30 (thirty) minutes.      EKGs/Labs/Other Studies Reviewed:    The following studies were reviewed today:  Cardiac Studies & Procedures       ECHOCARDIOGRAM  ECHOCARDIOGRAM COMPLETE 06/19/2021  Narrative ECHOCARDIOGRAM REPORT    Patient Name:   Denise Foley Date of Exam: 06/19/2021 Medical Rec #:  [id number]  Height:       64.0 in Accession #:    [id number] Weight:       146.0 lb Date of Birth:  1961/09/28  BSA:          1.711 m Patient Age:    60 years   BP:           105/68 mmHg Patient Gender: F          HR:           65 bpm. Exam Location:  [facility]  Procedure: 2D Echo  Indications:    PVC's (premature ventricular contractions) [I49.3 (ICD-10-CM)]; Orthostatic hypotension [I95.1 (ICD-10-CM)]; Stage 3b chronic kidney disease (HCC) [W29.56 (ICD-10-CM)]; Chest pain of uncertain etiology [R07.9 (ICD-10-CM)]  History:        Patient has prior history of Echocardiogram examinations, most recent 08/29/2011.  Sonographer:    Louie Boston Referring Phys: [phone] Jakari Jacot J Arika Mainer  IMPRESSIONS   1. Left ventricular ejection fraction, by estimation, is 60 to 65%. The left ventricle has normal function. The left ventricle has no regional wall motion abnormalities. Left ventricular diastolic parameters were normal. 2. Right ventricular systolic function is normal. The right ventricular size is normal. There is normal pulmonary artery systolic pressure. 3. The mitral valve is normal in structure. Mild mitral valve regurgitation. No evidence of mitral stenosis. 4. The aortic valve is normal in structure. Aortic valve regurgitation is not visualized. No aortic stenosis is present. 5. The inferior vena cava is normal in size with greater than 50% respiratory variability, suggesting right atrial pressure of 3 mmHg.  FINDINGS Left Ventricle: Left ventricular ejection fraction, by estimation, is 60 to  65%. The left ventricle has normal function. The left ventricle has no regional wall motion abnormalities. The left ventricular internal cavity size was normal in size. There is no left ventricular hypertrophy. Left ventricular diastolic parameters were normal.  Right Ventricle: The right ventricular size is normal. No increase in right ventricular wall thickness. Right ventricular systolic function is normal. There is normal pulmonary artery systolic pressure. The tricuspid regurgitant velocity is 1.78 m/s, and with an assumed right atrial pressure of 3 mmHg, the estimated right ventricular systolic pressure is 15.7 mmHg.  Left Atrium: Left atrial size was normal in size.  Right Atrium: Right atrial size was normal in size.  Pericardium: There is no evidence of pericardial effusion.  Mitral Valve: The mitral valve is normal in structure. Mild mitral valve regurgitation. No evidence of mitral valve stenosis.  Tricuspid Valve: The tricuspid valve is  normal in structure. Tricuspid valve regurgitation is mild . No evidence of tricuspid stenosis.  Aortic Valve: The aortic valve is normal in structure. Aortic valve regurgitation is not visualized. No aortic stenosis is present.  Pulmonic Valve: The pulmonic valve was normal in structure. Pulmonic valve regurgitation is not visualized. No evidence of pulmonic stenosis.  Aorta: The aortic root is normal in size and structure.  Venous: The inferior vena cava is normal in size with greater than 50% respiratory variability, suggesting right atrial pressure of 3 mmHg.  IAS/Shunts: No atrial level shunt detected by color flow Doppler.   LEFT VENTRICLE PLAX 2D LVIDd:         4.10 cm  Diastology LVIDs:         2.40 cm  LV e' medial:    5.77 cm/s LV PW:         0.90 cm  LV E/e' medial:  14.3 LV IVS:        0.90 cm  LV e' lateral:   11.20 cm/s LVOT diam:     1.90 cm  LV E/e' lateral: 7.4 LV SV:         50 LV SV Index:   29 LVOT Area:     2.84  cm   RIGHT VENTRICLE            IVC RV S prime:     8.81 cm/s  IVC diam: 1.50 cm TAPSE (M-mode): 1.6 cm  LEFT ATRIUM             Index       RIGHT ATRIUM           Index LA diam:        3.30 cm 1.93 cm/m  RA Area:     10.10 cm LA Vol (A2C):   37.4 ml 21.85 ml/m RA Volume:   17.60 ml  10.28 ml/m LA Vol (A4C):   21.0 ml 12.27 ml/m LA Biplane Vol: 30.8 ml 18.00 ml/m AORTIC VALVE LVOT Vmax:   86.20 cm/s LVOT Vmean:  58.600 cm/s LVOT VTI:    0.175 m  AORTA Ao Root diam: 2.90 cm Ao Asc diam:  3.00 cm Ao Desc diam: 1.80 cm  MITRAL VALVE               TRICUSPID VALVE MV Area (PHT): 3.50 cm    TR Peak grad:   12.7 mmHg MV Decel Time: 217 msec    TR Vmax:        178.00 cm/s MV E velocity: 82.70 cm/s MV A velocity: 84.00 cm/s  SHUNTS MV E/A ratio:  0.98        Systemic VTI:  0.18 m Systemic Diam: 1.90 cm  Gypsy Balsam MD Electronically signed by Gypsy Balsam MD Signature Date/Time: 06/19/2021/12:56:39 PM    Final     CT SCANS  CT CORONARY MORPH W/CTA COR W/SCORE 06/12/2021  Addendum 06/12/2021  4:33 PM ADDENDUM REPORT: 06/12/2021 16:30  CLINICAL DATA:  38F with chest pain  EXAM: Cardiac/Coronary CTA  TECHNIQUE: The patient was scanned on a [employer].  FINDINGS: A 100 kV prospective scan was triggered in the descending thoracic aorta at 111 HU's. Axial non-contrast 3 mm slices were carried out through the heart. The data set was analyzed on a dedicated work station and scored using the Agatson method. Gantry rotation speed was 250 msecs and collimation was .6 mm. 0.8 mg of sl NTG was given. The 3D data set was reconstructed in 5%  intervals of the 35-75 % of the R-R cycle. Phases were analyzed on a dedicated work station using MPR, MIP and VRT modes. The patient received 80 cc of contrast.  Coronary Arteries:  Normal coronary origin.  Right dominance.  RCA is a large dominant artery that gives rise to PDA and PLA. There is noncalcified  plaque in the proximal RCA causing 25-49% stenosis  Left main is a large artery that gives rise to LAD and LCX arteries.  LAD is a large vessel that has no plaque.  LCX is a non-dominant artery that gives rise to one large OM1 branch. There is no plaque.  Other findings:  Left Ventricle: Normal size  Left Atrium: Normal size  Pulmonary Veins: Normal configuration  Right Ventricle: Normal size  Right Atrium: Normal size  Cardiac valves: Mild AV calcifications  Thoracic aorta: Normal size  Pulmonary Arteries: Normal size  Systemic Veins: Normal drainage  Pericardium: Normal thickness  IMPRESSION: 1. Coronary calcium score of 0.  2. Normal coronary origin with right dominance.  3. 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  CAD-RADS 2. Mild non-obstructive CAD (25-49%). Consider non-atherosclerotic causes of chest pain. Consider preventive therapy and risk factor modification.   Electronically Signed By: Epifanio Lesches MD On: 06/12/2021 16:30  Narrative EXAM: OVER-READ INTERPRETATION  CT CHEST  The following report is an over-read performed by radiologist Dr. Trudie Reed of [facility] on 06/12/2021. This over-read does not include interpretation of cardiac or coronary anatomy or pathology. The coronary calcium score/coronary CTA interpretation by the cardiologist is attached.  COMPARISON:  None.  FINDINGS: Within the visualized portions of the thorax there are no suspicious appearing pulmonary nodules or masses, there is no acute consolidative airspace disease, no pleural effusions, no pneumothorax and no lymphadenopathy. Visualized portions of the upper abdomen are unremarkable. There are no aggressive appearing lytic or blastic lesions noted in the visualized portions of the skeleton.  IMPRESSION: No significant incidental noncardiac findings are noted.  Electronically  Signed: By: Trudie Reed M.D. On: 06/12/2021 14:14          EKG Interpretation Date/Time:  Thursday October 03 2023 13:08:15 EST Ventricular Rate:  74 PR Interval:  128 QRS Duration:  84 QT Interval:  374 QTC Calculation: 415 R Axis:   25  Text Interpretation: Normal sinus rhythm Normal ECG No previous ECGs available Confirmed by Norman Herrlich ([id number]) on 10/03/2023 1:18:36 PM   Recent Labs: 12/14/2022: TSH <0.01 Repeated and verified X2.  Recent Lipid Panel    Component Value Date/Time   CHOL 157 06/29/2022 0838   TRIG 103 06/29/2022 0838   HDL 63 06/29/2022 0838   CHOLHDL 2.5 06/29/2022 0838   LDLCALC 75 06/29/2022 0838    Physical Exam:    VS:  BP (!) 149/81 (BP Location: Left Arm, Patient Position: Sitting)   Pulse 74   Ht 5\' 4"  (1.626 m)   Wt 141 lb 6.4 oz (64.1 kg)   SpO2 99%   BMI 24.27 kg/m     Wt Readings from Last 3 Encounters:  10/03/23 141 lb 6.4 oz (64.1 kg)  05/10/23 141 lb (64 kg)  01/24/23 144 lb (65.3 kg)     GEN:  Well nourished, well developed in no acute distress HEENT: Normal NECK: No JVD; No carotid bruits LYMPHATICS: No lymphadenopathy CARDIAC: RRR, no murmurs, rubs, gallops RESPIRATORY:  Clear to auscultation without rales, wheezing or rhonchi  ABDOMEN: Soft, non-tender, non-distended MUSCULOSKELETAL:  No edema; No deformity  SKIN: Warm and dry NEUROLOGIC:  Alert and oriented x 3 PSYCHIATRIC:  Normal affect    Signed, Norman Herrlich, MD  10/03/2023 1:31 PM    [facility]

## 2023-10-04 LAB — COMPREHENSIVE METABOLIC PANEL
ALT: 14 [IU]/L (ref 0–32)
AST: 19 [IU]/L (ref 0–40)
Albumin: 4.6 g/dL (ref 3.9–4.9)
Alkaline Phosphatase: 57 [IU]/L (ref 44–121)
BUN/Creatinine Ratio: 14 (ref 12–28)
BUN: 20 mg/dL (ref 8–27)
Bilirubin Total: 0.3 mg/dL (ref 0.0–1.2)
CO2: 23 mmol/L (ref 20–29)
Calcium: 9.4 mg/dL (ref 8.7–10.3)
Chloride: 102 mmol/L (ref 96–106)
Creatinine, Ser: 1.43 mg/dL — ABNORMAL HIGH (ref 0.57–1.00)
Globulin, Total: 2.1 g/dL (ref 1.5–4.5)
Glucose: 94 mg/dL (ref 70–99)
Potassium: 4.9 mmol/L (ref 3.5–5.2)
Sodium: 138 mmol/L (ref 134–144)
Total Protein: 6.7 g/dL (ref 6.0–8.5)
eGFR: 42 mL/min/{1.73_m2} — ABNORMAL LOW (ref >59)

## 2023-10-04 LAB — LIPID PANEL
Chol/HDL Ratio: 2.5 ratio (ref 0.0–4.4)
Cholesterol, Total: 173 mg/dL (ref 100–199)
HDL: 69 mg/dL (ref >39)
LDL Chol Calc (NIH): 74 mg/dL (ref 0–99)
Triglycerides: 181 mg/dL — ABNORMAL HIGH (ref 0–149)
VLDL Cholesterol Cal: 30 mg/dL (ref 5–40)

## 2023-10-23 ENCOUNTER — Other Ambulatory Visit: Payer: Self-pay | Admitting: Medical Genetics

## 2023-10-30 ENCOUNTER — Other Ambulatory Visit: Payer: Self-pay | Admitting: Medical Genetics

## 2023-11-01 NOTE — Progress Notes (Deleted)
NEUROLOGY FOLLOW UP OFFICE NOTE  Denise Foley [id number]  Subjective:  Denise Foley is a 62 y.o. year old *** handed *** with a history of *** who we last saw on *** for ***.  To briefly review: Patient has had migraines since childhood. She mentions her migraines were worse in [place] and stress from prior marriage. They have been better since moving to [place] in 2018. The headaches are mostly over the right temple. It is a pulsating sensation and pounding with an occasional ice pick like sensation. She mentions weather changes are a trigger. Her average headache 7/10. She gets blurry vision, flashing lights prior to her migraines. She endorses photophobia and phonophobia and nausea. When she was still menstruating, the migraines were worse at that time and would cause vomiting and passing out. She may also have drooping of eyes, but no focal numbness or weakness with her headaches. The headaches are not positional. She may know it is coming a day prior to her headache. When she has a headache, it will usually last a day with a residual headache the next day. She has 1-2 migraines per month.    She has another type of headache. The headache is also located on the left temple, occasionally across the forehead. It is a pressure sensation, 3-4/10. It is basically a background constant headache between her migraines.    She has occasional neck pain. She mentions she has a lot of pain, but deals with it.   She has a strong family history of migraines. She drinks one cup of green tea per day. No EtOH or tobacco use.   Patient does not sleep well. She states she does not get much sleep, maybe anxiety driven by her report. She has had multiple sleep studies before and told she does not have OSA but "does not get any REM sleep" per her report. She endorses daytime sleepiness and waking not feeling well rested.   Patient is currently taking just extra strength tylenol for her headaches. She takes this 4-5 times  per week.   Patient has tried multiple medications in the past.  Patient was on Sumatriptan and had left sided numbness and felt weird. She went to the ED and felt she may have had a TIA and told to stop sumatriptan.   Per [facility] note from 03/13/18: Did not tolerate Topamax, Propranolol, Depakote, Gabapentin, Lyrica, Cymbalta, Imitrex, and Maxalt.  She was also given trigger point injections. This helped some. She got Botox injections but in her chest, not her head.  She has had neck pain since her late thirties. Currently it is midline and radiates to her trapezius muscles, between her shoulder blades and to the base of her skull.  Butterbur extract was working at that time but then stopped working.   Tried Nurtec sample in 03/2022 (2 tablets) worked a little (took the edge off). Tried Ubrelvy sample in 05/2022 which she is not sure of benefit. Tried Aimovig (1 shot) - unclear benefit   Vitamins: Vit D, B12 1000 mcg daily.   She has not tried amitriptyline, nortriptyline, venlafaxine, or lamotrigine.   Patient mentions a history of a neck mass and removal of a pituitary macroadenoma as well.   01/24/23: MRI brain was similar to prior. MRI cervical spine was unremarkable. TSH was < 0.01. I recommended she contact her PCP or endocrinologist about this. Her ESR and CRP were normal.   Patient was having racing heart since starting Nortriptyline. Patient was not sure  if it was related to nightly xanax, so she stopped the xanax. Then she had swelling and twitching of tongue. She does have a history of angioedema. She stopped the Nortriptyline after this. The Nortriptyline was only taken for 12 days.   She has had one migraine since her last appointment. Nurtec has really helped. She still has a background daily headache.  05/10/23: Patient was not able to tolerate Topamax (made headaches worse, caused restless legs). She had previously not been able to tolerate  propranolol, depakote, gabapentin,  cymbalta. She was on Nurtec but still having to take ~ 9 doses per month for migraine. I stopped Topamax and ordered Aimovig for migraine prevention on 02/11/23. Medication was approved on 03/19/23. The medication was $150 per month, so patient has not picked it up.   Patient has about 6 headaches per month. She is taking Nurtec. This will work for milder headaches. She does not know of anything that helps massive headaches, but she has not had one of these since starting Nurtec.   Patient has neck pain and tightness of shoulders as well. She has no other complaints today.  Most recent Assessment and Plan (05/10/23): This is Denise Foley, a 62 y.o. female with episodic migraine with aura. She currently has 6 migraines per month, responding to Nurtec. Patient was not able to tolerate notriptyline or topamax. She has Aimovig approved since 02/2023 but has not picked it up due to cost. There has been improvement in intensity and some improvement in frequency (9 migraines per month previously).    Plan: Migraine prevention:  I will try to get Aimovig cheaper or will switch Aimovig to a more affordable medication. I will resend to PA team for a tier exception (patient has Medicare part D and is not eligible for copay card). Migraine rescue:  Nurtec 75 mg as needed, Zofran for nausea Limit use of pain relievers to no more than 2 days out of week to prevent risk of rebound or medication-overuse headache. Keep headache diary Physical therapy for neck pain ([facility])  Since their last visit: ***  MEDICATIONS:  Outpatient Encounter Medications as of 11/13/2023  Medication Sig   ALPRAZolam (XANAX) 0.25 MG tablet Take 0.25 mg by mouth at bedtime as needed for anxiety.   aspirin EC 81 MG tablet Take 81 mg by mouth daily. Swallow whole.   Calcium Carbonate-Vitamin D ([employer] CALCIUM/VIT D SOFT CHEWS PO) Take 5,000 capsules by mouth every 30 (thirty) days.   clotrimazole-betamethasone (LOTRISONE) cream Apply 1  application topically as needed (fungus).   FLUDROCORTISONE ACETATE PO Take 0.1 mg by mouth daily. Take 1/2 tablet daily   hydrocortisone (CORTEF) 5 MG tablet Take 5 mg by mouth 2 (two) times daily.   lansoprazole (PREVACID) 15 MG capsule Take 15 mg by mouth daily at 12 noon.   levothyroxine (SYNTHROID) 88 MCG tablet Take 88 mcg by mouth daily before breakfast.   liothyronine (CYTOMEL) 5 MCG tablet Take 2.5 mcg by mouth daily.   Methylcobalamin (B12-ACTIVE) 1 MG CHEW Chew 1 m by mouth daily.   Multiple Vitamin (MULTIVITAMIN) tablet Take 1 tablet by mouth daily.   ondansetron (ZOFRAN) 4 MG tablet Take 4 mg by mouth every 8 (eight) hours as needed for nausea or vomiting.   pravastatin (PRAVACHOL) 20 MG tablet TAKE 1 TABLET EVERY EVENING   Rimegepant Sulfate (NURTEC) 75 MG TBDP Take 1 tablet (75 mg total) by mouth as needed. (Patient taking differently: Take 75 mg by mouth as needed (Migraine).)  Vitamin D, Ergocalciferol, (DRISDOL) 1.25 MG (50000 UNIT) CAPS capsule Take 50,000 Units by mouth every 30 (thirty) minutes.   No facility-administered encounter medications on file as of 11/13/2023.    PAST MEDICAL HISTORY: Past Medical History:  Diagnosis Date   Abrasion of left cornea 06/11/2018   Addison's disease (HCC)    Adrenal insufficiency (HCC) 11/07/2012   Formatting of this note might be different from the original.  S/p bilateral adrenalectomy for pituitary cushing's.     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   Osteoporosis    Pott's disease     PAST SURGICAL HISTORY: Past Surgical History:  Procedure Laterality Date   ABDOMINAL HYSTERECTOMY  1993    ADRENALECTOMY Bilateral 2002   CHOLECYSTECTOMY  2003   FRACTURE SURGERY Left 2005   ankle   PAROTIDECTOMY  1993   PVC ABLATION  2012   TONSILLECTOMY  1977   TUBAL LIGATION  1989   TUMOR EXCISION  2000   pituitary    ALLERGIES: Allergies  Allergen Reactions   Nortriptyline Hcl Other (See Comments)    angioedema   Codeine     Other Reaction(s): GI Intolerance  Stomach upset   Cymbalta [Duloxetine Hcl]    Depakote [Divalproex Sodium]    Duloxetine     Other Reaction(s): Diarrhea, GI Intolerance   Lamictal [Lamotrigine]    Lyrica [Pregabalin]    Sumatriptan    Tegretol [Carbamazepine]    Toprol Xl [Metoprolol]    Trazodone And Nefazodone    Wellbutrin [Bupropion]    Zonisamide    Tape Rash    FAMILY HISTORY: Family History  Problem Relation Age of Onset   Thyroid disease Mother    Lung cancer Mother    Liver cancer Mother    Congestive Heart Failure Father    Stroke Maternal Grandmother     SOCIAL HISTORY: Social History   Tobacco Use   Smoking status: Former    Types: Cigarettes   Smokeless tobacco: Never  Vaping Use   Vaping status: Never Used  Substance Use Topics   Alcohol use: Never   Drug use: Never   Social History   Social History Narrative   Are you right handed or left handed? Right   Are you currently employed ? no   What is your current occupation?   Do you live at home alone? daughter   Who lives with you?    What type of home do you live in: 1 story or 2 story? two    Caffeine 1 cup daily      Objective:  Vital Signs:  There were no vitals taken for this visit.  ***  Labs and Imaging review: New results: 10/03/23: Lipid panel: tChol 173, LDL 74, TG 181 CMP significant for Cr 1.43 (chronic)  External: 06/24/23: Vit D wnl T4 wnl  Previously reviewed results: 12/14/22: TSH: < 0.01 ESR: 19 CRP: < 1.0   CMP (06/29/22) significant for Cr 1.66   External labs: PTH (05/04/22) wnl Free T3 (05/04/22): elevated to 4.47 Free T4  (05/04/22): 1.1 Vit D (09/05/21): 72 HbA1c (12/02/2018): 5.4   Imaging: MRI brain w/wo contrast (01/06/23): FINDINGS: Brain: There  is no evidence of acute infarct, hemorrhage, midline shift or extra-axial fluid collection. The ventricles and sulci are normal. A few punctate foci of T2 hyperintensity in the cerebral white matter are nonspecific and within normal limits for age.   Dedicated imaging was performed through the sella turcica. Sequelae of transsphenoidal pituitary surgery are identified. A small amount of enhancing soft tissue along the sellar floor bilaterally likely reflects normal residual pituitary, with the infundibulum inserting into this tissue to the left of midline. There is a 6 mm focus of enhancing soft tissue extending from the ventral margin of the sella into the superior aspect of the sphenoid sinus on the left which is partially inseparable from and demonstrates similar signal characteristics to residual pituitary tissue. The optic chiasm and cavernous sinuses are unremarkable.   Vascular: Major intracranial vascular flow voids are preserved.   Skull and upper cervical spine: No suspicious marrow lesion.   Sinuses/Orbits: Unremarkable orbits. Postoperative changes in the sphenoid sinuses. Minimal mucosal thickening in the right maxillary sinus. Clear mastoid air cells.   Other: In the carotid space of the right upper neck medial to the internal jugular vein and just below the jugular foramen is an elongated mass measuring 8 x 9 x 29 mm (AP x transverse x craniocaudal). The majority of the mass is T2 hyperintense with suppression on FLAIR imaging, however the central portion of the mass is T2 hypointense and enhancing. This mass was also described in multiple prior outside MRI reports, and the portion of the mass included on the 2022 CT is fluid density.   IMPRESSION: 1. Postoperative changes from transsphenoidal pituitary surgery. 6 mm focus of enhancing  soft tissue extending from the ventral margin of the sella into the superior aspect of the sphenoid sinus on the left could reflect postoperative granulation tissue or a small amount of residual tumor. 2. Otherwise unremarkable appearance of the brain for age. 3. 3 cm mass just below the jugular foramen in the carotid space of the right upper neck, longstanding based on prior outside MRI reports and benign in appearance, possibly a schwannoma or cyst with traversing neural elements.   MRI cervical spine w/wo contrast (01/06/23): FINDINGS: Alignment: Trace retrolisthesis of C3 on C4 and C4 on C5.   Vertebrae: No fracture or suspicious marrow lesion. Modic type 1 endplate changes at C3-4.   Cord: Normal cord signal and morphology. No abnormal intradural enhancement.   Posterior Fossa, vertebral arteries, paraspinal tissues: The posterior fossa and the right carotid space mass in the upper neck are more fully evaluated on the contemporaneous head MRI. Preserved vertebral artery flow voids.   Disc levels:   Mild-to-moderate disc space narrowing from C3-4 through C6-7.   C2-3: Negative.   C3-4: Disc bulging and mild uncovertebral spurring without significant stenosis.   C4-5: Broad-based posterior disc osteophyte complex results in mild right neural foraminal stenosis without spinal stenosis.   C5-6: Disc bulging and uncovertebral spurring result in mild right neural foraminal stenosis without spinal stenosis.   C6-7: Disc bulging and uncovertebral spurring without significant stenosis.   C7-T1: Mild-to-moderate facet arthrosis without disc herniation or stenosis.   IMPRESSION: 1. Cervical disc and facet degeneration with mild right neural foraminal stenosis at C4-5 and C5-6. No spinal stenosis. 2. Right upper neck mass reported on today's separate head MRI.   [facility] (04/21/21): Report only: Mild sinus disease, otherwise unremarkable   MRI brain wo contrast (external,  report only - 05/22/16): Indication: left face droop, concern for stroke.  Impression: No evidence of acute intracranial process.   MRI brain w/wo contrast (external, report only - 03/09/2016):  Indication: Headaches, problems with left eye, neck mass seen on prior MRI. Intracranial mass. Pituitary macroadenoma.  Assessment/Plan:  This is Denise Foley, a 62 y.o. female with: ***   Plan: ***  Return to clinic in ***  Total time spent reviewing records, interview, history/exam, documentation, and coordination of care on day of encounter:  *** min  Jacquelyne Balint, MD

## 2023-11-11 ENCOUNTER — Other Ambulatory Visit: Payer: Self-pay | Admitting: Cardiology

## 2023-11-11 DIAGNOSIS — N1832 Chronic kidney disease, stage 3b: Secondary | ICD-10-CM

## 2023-11-11 DIAGNOSIS — I951 Orthostatic hypotension: Secondary | ICD-10-CM

## 2023-11-11 DIAGNOSIS — I493 Ventricular premature depolarization: Secondary | ICD-10-CM

## 2023-11-13 ENCOUNTER — Ambulatory Visit: Payer: Medicare Other | Admitting: Neurology

## 2023-11-14 DIAGNOSIS — N1832 Chronic kidney disease, stage 3b: Secondary | ICD-10-CM | POA: Diagnosis not present

## 2023-11-19 DIAGNOSIS — I129 Hypertensive chronic kidney disease with stage 1 through stage 4 chronic kidney disease, or unspecified chronic kidney disease: Secondary | ICD-10-CM | POA: Diagnosis not present

## 2023-11-19 DIAGNOSIS — N1832 Chronic kidney disease, stage 3b: Secondary | ICD-10-CM | POA: Diagnosis not present

## 2023-11-19 DIAGNOSIS — E2740 Unspecified adrenocortical insufficiency: Secondary | ICD-10-CM | POA: Diagnosis not present

## 2023-11-19 DIAGNOSIS — E896 Postprocedural adrenocortical (-medullary) hypofunction: Secondary | ICD-10-CM | POA: Diagnosis not present

## 2023-12-04 DIAGNOSIS — I6529 Occlusion and stenosis of unspecified carotid artery: Secondary | ICD-10-CM | POA: Diagnosis not present

## 2023-12-04 DIAGNOSIS — E785 Hyperlipidemia, unspecified: Secondary | ICD-10-CM | POA: Diagnosis not present

## 2023-12-04 DIAGNOSIS — I2510 Atherosclerotic heart disease of native coronary artery without angina pectoris: Secondary | ICD-10-CM | POA: Diagnosis not present

## 2023-12-04 DIAGNOSIS — F419 Anxiety disorder, unspecified: Secondary | ICD-10-CM | POA: Diagnosis not present

## 2023-12-17 DIAGNOSIS — Z9181 History of falling: Secondary | ICD-10-CM | POA: Diagnosis not present

## 2023-12-17 DIAGNOSIS — Z0000 Encounter for general adult medical examination without abnormal findings: Secondary | ICD-10-CM | POA: Diagnosis not present

## 2023-12-18 DIAGNOSIS — Z1231 Encounter for screening mammogram for malignant neoplasm of breast: Secondary | ICD-10-CM | POA: Diagnosis not present

## 2023-12-20 DIAGNOSIS — F419 Anxiety disorder, unspecified: Secondary | ICD-10-CM | POA: Diagnosis not present

## 2023-12-20 DIAGNOSIS — Z6824 Body mass index (BMI) 24.0-24.9, adult: Secondary | ICD-10-CM | POA: Diagnosis not present

## 2023-12-20 DIAGNOSIS — E039 Hypothyroidism, unspecified: Secondary | ICD-10-CM | POA: Diagnosis not present

## 2023-12-20 DIAGNOSIS — R29818 Other symptoms and signs involving the nervous system: Secondary | ICD-10-CM | POA: Diagnosis not present

## 2023-12-20 DIAGNOSIS — Z1231 Encounter for screening mammogram for malignant neoplasm of breast: Secondary | ICD-10-CM | POA: Diagnosis not present

## 2023-12-25 DIAGNOSIS — E2740 Unspecified adrenocortical insufficiency: Secondary | ICD-10-CM | POA: Diagnosis not present

## 2023-12-25 DIAGNOSIS — E038 Other specified hypothyroidism: Secondary | ICD-10-CM | POA: Diagnosis not present

## 2023-12-25 DIAGNOSIS — M810 Age-related osteoporosis without current pathological fracture: Secondary | ICD-10-CM | POA: Diagnosis not present

## 2023-12-25 DIAGNOSIS — E559 Vitamin D deficiency, unspecified: Secondary | ICD-10-CM | POA: Diagnosis not present

## 2023-12-26 ENCOUNTER — Other Ambulatory Visit (HOSPITAL_COMMUNITY): Payer: Self-pay

## 2024-01-10 ENCOUNTER — Telehealth: Payer: Self-pay

## 2024-01-10 DIAGNOSIS — I951 Orthostatic hypotension: Secondary | ICD-10-CM

## 2024-01-10 DIAGNOSIS — I493 Ventricular premature depolarization: Secondary | ICD-10-CM

## 2024-01-10 DIAGNOSIS — N1832 Chronic kidney disease, stage 3b: Secondary | ICD-10-CM

## 2024-01-10 MED ORDER — PRAVASTATIN SODIUM 20 MG PO TABS: 20.0000 mg | ORAL_TABLET | Freq: Every day | ORAL | 2 refills | Status: DC

## 2024-01-10 NOTE — Telephone Encounter (Signed)
Prescription sent to pharmacy.

## 2024-01-17 DIAGNOSIS — Z6823 Body mass index (BMI) 23.0-23.9, adult: Secondary | ICD-10-CM | POA: Diagnosis not present

## 2024-01-17 DIAGNOSIS — R509 Fever, unspecified: Secondary | ICD-10-CM | POA: Diagnosis not present

## 2024-01-17 DIAGNOSIS — Z1331 Encounter for screening for depression: Secondary | ICD-10-CM | POA: Diagnosis not present

## 2024-01-17 DIAGNOSIS — F419 Anxiety disorder, unspecified: Secondary | ICD-10-CM | POA: Diagnosis not present

## 2024-01-17 DIAGNOSIS — M791 Myalgia, unspecified site: Secondary | ICD-10-CM | POA: Diagnosis not present

## 2024-02-07 DIAGNOSIS — Z131 Encounter for screening for diabetes mellitus: Secondary | ICD-10-CM | POA: Diagnosis not present

## 2024-02-07 DIAGNOSIS — Z6824 Body mass index (BMI) 24.0-24.9, adult: Secondary | ICD-10-CM | POA: Diagnosis not present

## 2024-02-07 DIAGNOSIS — E785 Hyperlipidemia, unspecified: Secondary | ICD-10-CM | POA: Diagnosis not present

## 2024-02-07 DIAGNOSIS — N1832 Chronic kidney disease, stage 3b: Secondary | ICD-10-CM | POA: Diagnosis not present

## 2024-02-07 DIAGNOSIS — Z0189 Encounter for other specified special examinations: Secondary | ICD-10-CM | POA: Diagnosis not present

## 2024-02-13 ENCOUNTER — Other Ambulatory Visit (HOSPITAL_COMMUNITY): Payer: Self-pay

## 2024-02-17 DIAGNOSIS — F411 Generalized anxiety disorder: Secondary | ICD-10-CM | POA: Insufficient documentation

## 2024-02-17 DIAGNOSIS — F902 Attention-deficit hyperactivity disorder, combined type: Secondary | ICD-10-CM | POA: Insufficient documentation

## 2024-03-16 DIAGNOSIS — F411 Generalized anxiety disorder: Secondary | ICD-10-CM | POA: Diagnosis not present

## 2024-03-16 DIAGNOSIS — F902 Attention-deficit hyperactivity disorder, combined type: Secondary | ICD-10-CM | POA: Diagnosis not present

## 2024-03-16 DIAGNOSIS — G4701 Insomnia due to medical condition: Secondary | ICD-10-CM | POA: Diagnosis not present

## 2024-04-27 DIAGNOSIS — F902 Attention-deficit hyperactivity disorder, combined type: Secondary | ICD-10-CM | POA: Diagnosis not present

## 2024-04-27 DIAGNOSIS — G4701 Insomnia due to medical condition: Secondary | ICD-10-CM | POA: Diagnosis not present

## 2024-04-27 DIAGNOSIS — F411 Generalized anxiety disorder: Secondary | ICD-10-CM | POA: Diagnosis not present

## 2024-05-12 DIAGNOSIS — N1832 Chronic kidney disease, stage 3b: Secondary | ICD-10-CM | POA: Diagnosis not present

## 2024-05-15 DIAGNOSIS — F320 Major depressive disorder, single episode, mild: Secondary | ICD-10-CM | POA: Diagnosis not present

## 2024-05-15 DIAGNOSIS — G8929 Other chronic pain: Secondary | ICD-10-CM | POA: Diagnosis not present

## 2024-05-18 ENCOUNTER — Encounter (HOSPITAL_BASED_OUTPATIENT_CLINIC_OR_DEPARTMENT_OTHER): Payer: Self-pay

## 2024-05-18 ENCOUNTER — Other Ambulatory Visit (HOSPITAL_BASED_OUTPATIENT_CLINIC_OR_DEPARTMENT_OTHER): Payer: Self-pay

## 2024-05-18 ENCOUNTER — Ambulatory Visit (HOSPITAL_BASED_OUTPATIENT_CLINIC_OR_DEPARTMENT_OTHER)
Admission: RE | Admit: 2024-05-18 | Discharge: 2024-05-18 | Disposition: A | Discharge status: Home / Self Care | Source: Ambulatory Visit | Attending: Family Medicine | Admitting: Family Medicine

## 2024-05-18 VITALS — BP 124/83 | HR 61 | Temp 98.3°F | Resp 18

## 2024-05-18 DIAGNOSIS — N939 Abnormal uterine and vaginal bleeding, unspecified: Secondary | ICD-10-CM | POA: Diagnosis not present

## 2024-05-18 DIAGNOSIS — L03315 Cellulitis of perineum: Secondary | ICD-10-CM | POA: Diagnosis not present

## 2024-05-18 DIAGNOSIS — N764 Abscess of vulva: Secondary | ICD-10-CM | POA: Diagnosis not present

## 2024-05-18 DIAGNOSIS — R110 Nausea: Secondary | ICD-10-CM | POA: Diagnosis not present

## 2024-05-18 DIAGNOSIS — N368 Other specified disorders of urethra: Secondary | ICD-10-CM | POA: Diagnosis not present

## 2024-05-18 DIAGNOSIS — N762 Acute vulvitis: Secondary | ICD-10-CM | POA: Insufficient documentation

## 2024-05-18 MED ORDER — MUPIROCIN 2 % EX OINT
1.0000 | TOPICAL_OINTMENT | Freq: Two times a day (BID) | CUTANEOUS | 0 refills | Status: DC
  Filled 2024-05-18: qty 22, 11d supply, fill #0

## 2024-05-18 MED ORDER — CLINDAMYCIN HCL 300 MG PO CAPS
300.0000 mg | ORAL_CAPSULE | Freq: Three times a day (TID) | ORAL | 0 refills | Status: AC
  Filled 2024-05-18: qty 30, 10d supply, fill #0

## 2024-05-18 MED ORDER — FLUCONAZOLE 150 MG PO TABS
ORAL_TABLET | ORAL | 0 refills | Status: DC
  Filled 2024-05-18: qty 4, 30d supply, fill #0

## 2024-05-18 NOTE — Discharge Instructions (Addendum)
 Abscess and cellulitis of the clitoral hood: Clindamycin 300 mg 3 times daily with food for 10 days.  Clean with warm soapy fingers.  Do not mash or press the area.  Culture collected and will adjust the plan of care, if needed once the culture results.  Use mupirocin ointment topically if needed.  Provided fluconazole, 150 mg 1 now and repeat every 5 to 7 days for up to a maximum of 4 doses, if needed for vaginal yeast infection due to antibiotic use.  Follow-up with gynecology.  Return here if needed.

## 2024-05-18 NOTE — ED Provider Notes (Signed)
 [facility]    CSN: [id number] Arrival date & time: 05/18/24  1027      History   Chief Complaint Chief Complaint  Patient presents with   Vaginal Bleeding    HPI Denise Foley is a 63 y.o. female.   Patient reports a carbuncle or cyst at her urethra.  She reports has been present since November 2024.  During the night it was swollen, tender and bleeding.  At times it has been as large as a plum.  It is tender and painful.  It got much worse in the last month.  She has an appoint with gynecology next month.  She reports having nausea and feeling lightheaded this morning.  Denies vomiting, constipation, diarrhea, fever.   Vaginal Bleeding Associated symptoms: nausea   Associated symptoms: no abdominal pain, no back pain, no dysuria and no fever     Past Medical History:  Diagnosis Date   Abrasion of left cornea 06/11/2018   Addison's disease (HCC)    Adrenal insufficiency (HCC) 11/07/2012   Formatting of this note might be different from the original.  S/p bilateral adrenalectomy for pituitary cushing's.     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/29/2022   Addison's disease (HCC) 07/17/2021   Arthritis 07/17/2021   CKD (chronic kidney disease), symptom management only, stage 3 (moderate) (HCC) 07/17/2021   GERD (gastroesophageal reflux disease) 07/17/2021   Hypothyroidism 07/17/2021   Osteoporosis 07/17/2021   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      Home Medications    Prior to Admission medications   Medication Sig Start Date End Date Taking? Authorizing Provider  clindamycin (CLEOCIN) 300 MG capsule Take 1 capsule (300 mg total) by mouth 3 (three) times daily after meals for 10 days. 05/18/24 05/28/24 Yes Ival Domino, FNP  fluconazole (DIFLUCAN) 150 MG tablet Take 1 tablet by mouth today and repeat every 5 days for vaginal yeast infection.  May use up to four pills, if needed 05/18/24  Yes Ival Domino, FNP  FLUDROCORTISONE ACETATE PO Take 0.1 mg by mouth daily. Take 1/2 tablet daily   Yes [provider]  hydrocortisone (CORTEF) 5 MG tablet Take 5 mg by mouth 2 (two) times daily.   Yes [provider]  levothyroxine (SYNTHROID) 88 MCG tablet Take 88 mcg by mouth daily before breakfast.   Yes  [provider]  liothyronine (CYTOMEL) 5 MCG tablet Take 2.5 mcg by mouth daily.   Yes [provider]  mupirocin ointment (BACTROBAN) 2 % Apply 1 Application topically 2 (two) times daily. 05/18/24  Yes Ival Domino, FNP  pravastatin  (PRAVACHOL ) 20 MG tablet Take 1 tablet (20 mg total) by mouth daily. 01/10/24  Yes Monetta Redell PARAS, MD  ALPRAZolam (XANAX) 0.25 MG tablet Take 0.25 mg by mouth at bedtime as needed for anxiety.    [provider]  aspirin EC 81 MG tablet Take 81 mg by mouth daily. Swallow whole.    [provider]  Calcium Carbonate-Vitamin D ([employer] CALCIUM/VIT D SOFT CHEWS PO)  Take 5,000 capsules by mouth every 30 (thirty) days.    [provider]  clotrimazole-betamethasone (LOTRISONE) cream Apply 1 application topically as needed (fungus).    [provider]  lansoprazole (PREVACID) 15 MG capsule Take 15 mg by mouth daily at 12 noon.    [provider]  Methylcobalamin (B12-ACTIVE) 1 MG CHEW Chew 1 m by mouth daily.    [provider]  Multiple Vitamin (MULTIVITAMIN) tablet Take 1 tablet by mouth daily.    [provider]  ondansetron  (ZOFRAN ) 4 MG tablet Take 4 mg by mouth every 8 (eight) hours as needed for nausea or vomiting.    [provider]  Rimegepant Sulfate (NURTEC) 75 MG TBDP Take 1 tablet (75 mg total) by mouth as needed. Patient taking differently: Take 75 mg by mouth as needed (Migraine). 12/14/22   Leigh Venetia CROME, MD  Vitamin D, Ergocalciferol, (DRISDOL) 1.25 MG (50000 UNIT) CAPS capsule Take 50,000 Units by mouth every 30 (thirty) minutes.    [provider]    Family History Family History  Problem Relation Age of Onset   Thyroid  disease Mother    Lung cancer Mother    Liver cancer Mother    Congestive Heart Failure Father    Stroke Maternal Grandmother     Social History Social History   Tobacco Use   Smoking status: Former    Types: Cigarettes   Smokeless tobacco: Never  Vaping Use   Vaping status: Never Used  Substance Use Topics   Alcohol use: Never   Drug use: Never     Allergies   Nortriptyline  hcl, Codeine, Cymbalta [duloxetine hcl], Depakote [divalproex sodium], Duloxetine, Lamictal [lamotrigine], Lyrica [pregabalin], Sumatriptan, Tegretol [carbamazepine], Toprol  xl [metoprolol ], Trazodone and nefazodone, Wellbutrin [bupropion], Zonisamide, and Tape   Review of Systems Review of Systems  Constitutional:  Negative for chills and fever.  HENT:  Negative for ear pain and sore throat.   Eyes:  Negative for pain and visual disturbance.  Respiratory:  Negative  for cough and shortness of breath.   Cardiovascular:  Negative for chest pain and palpitations.  Gastrointestinal:  Positive for nausea. Negative for abdominal pain, constipation, diarrhea and vomiting.  Genitourinary:  Positive for vaginal bleeding. Negative for dysuria and hematuria.  Musculoskeletal:  Negative for arthralgias and back pain.  Skin:  Negative for color change and rash.  Neurological:  Positive for light-headedness. Negative for seizures and syncope.  All other systems reviewed and are negative.    Physical Exam Triage Vital Signs ED Triage Vitals  Encounter Vitals Group     BP 05/18/24 1049 124/83     Girls Systolic BP Percentile --      Girls Diastolic BP Percentile --      Boys Systolic BP Percentile --      Boys Diastolic  BP Percentile --      Pulse Rate 05/18/24 1049 61     Resp 05/18/24 1049 18     Temp 05/18/24 1049 98.3 F (36.8 C)     Temp Source 05/18/24 1049 Oral     SpO2 05/18/24 1049 97 %     Weight --      Height --      Head Circumference --      Peak Flow --      Pain Score 05/18/24 1047 0     Pain Loc --      Pain Education --      Exclude from Growth Chart --    No data found.  Updated Vital Signs BP 124/83 (BP Location: Left Arm)   Pulse 61   Temp 98.3 F (36.8 C) (Oral)   Resp 18   SpO2 97%   Visual Acuity Right Eye Distance:   Left Eye Distance:   Bilateral Distance:    Right Eye Near:   Left Eye Near:    Bilateral Near:     Physical Exam Vitals and nursing note reviewed. Exam conducted with a chaperone present Johnnie Plana, Charity fundraiser).  Constitutional:      General: She is not in acute distress.    Appearance: She is well-developed. She is not ill-appearing or toxic-appearing.  HENT:     Head: Normocephalic and atraumatic.     Right Ear: External ear normal.     Left Ear: External ear normal.     Nose: Nose normal.     Mouth/Throat:     Lips: Pink.     Mouth: Mucous membranes are moist.   Eyes:      Conjunctiva/sclera: Conjunctivae normal.     Pupils: Pupils are equal, round, and reactive to light.    Cardiovascular:     Rate and Rhythm: Normal rate and regular rhythm.     Heart sounds: S1 normal and S2 normal. No murmur heard. Pulmonary:     Effort: Pulmonary effort is normal. No respiratory distress.     Breath sounds: Normal breath sounds. No decreased breath sounds, wheezing, rhonchi or rales.  Abdominal:     Hernia: There is no hernia in the left inguinal area or right inguinal area.  Genitourinary:    Exam position: Lithotomy position.     Labia:        Right: Rash (Erythema) and tenderness present. No lesion or injury.        Left: Rash (Erythema) and tenderness present. No lesion or injury.      Urethra: No prolapse, urethral pain, urethral swelling or urethral lesion.     Comments: Significant swelling of the clitoris also could.  The area is 3 or 4 times normal size and approximately the size of the kind in the shell.  The area is edematous, erythematous with a serous/purulent discharge with a strong odor.  There is an open weeping area above into the right of the clitoris.  Musculoskeletal:        General: No swelling.  Lymphadenopathy:     Lower Body: Right inguinal adenopathy present. Left inguinal adenopathy present.   Skin:    General: Skin is warm and dry.     Capillary Refill: Capillary refill takes less than 2 seconds.     Findings: No rash.   Neurological:     Mental Status: She is alert and oriented to person, place, and time.   Psychiatric:  Mood and Affect: Mood normal.      UC Treatments / Results  Labs (all labs ordered are listed, but only abnormal results are displayed) Labs Reviewed  AEROBIC CULTURE W GRAM STAIN (SUPERFICIAL SPECIMEN)    EKG   Radiology No results found.  Procedures Procedures (including critical care time)  Medications Ordered in UC Medications - No data to display  Initial Impression / Assessment and  Plan / UC Course  I have reviewed the triage vital signs and the nursing notes.  Pertinent labs & imaging results that were available during my care of the patient were reviewed by me and considered in my medical decision making (see chart for details).  Plan of Care: Cellulitis and abscess of the clitoral hood: Clindamycin 300 mg 3 times daily with food for 10 days.  Use mupirocin topically.  Fluconazole 150 mg 1 pill every 5 to 7 days if needed for vaginal yeast infection.  See discharge instructions for specific instructions to patient.  Follow-up with gynecology.  Follow-up here as needed.  I reviewed the plan of care with the patient and/or the patient's guardian.  The patient and/or guardian had time to ask questions and acknowledged that the questions were answered.  I provided instruction on symptoms or reasons to return here or to go to an ER, if symptoms/condition did not improve, worsened or if new symptoms occurred.  Final Clinical Impressions(s) / UC Diagnoses   Final diagnoses:  Cellulitis of perineum  Inflammation of clitoris     Discharge Instructions      Abscess and cellulitis of the clitoral hood: Clindamycin 300 mg 3 times daily with food for 10 days.  Clean with warm soapy fingers.  Do not mash or press the area.  Culture collected and will adjust the plan of care, if needed once the culture results.  Use mupirocin ointment topically if needed.  Provided fluconazole, 150 mg 1 now and repeat every 5 to 7 days for up to a maximum of 4 doses, if needed for vaginal yeast infection due to antibiotic use.  Follow-up with gynecology.  Return here if needed.     ED Prescriptions     Medication Sig Dispense Auth. Provider   clindamycin (CLEOCIN) 300 MG capsule Take 1 capsule (300 mg total) by mouth 3 (three) times daily after meals for 10 days. 30 capsule Ival Domino, FNP   mupirocin ointment (BACTROBAN) 2 % Apply 1 Application topically 2 (two) times daily. 22 g  Ival Domino, FNP   fluconazole (DIFLUCAN) 150 MG tablet Take 1 tablet by mouth today and repeat every 5 days for vaginal yeast infection.  May use up to four pills, if needed 4 tablet Ival Domino, FNP      PDMP not reviewed this encounter.   Ival Domino, FNP 05/18/24 [phone]

## 2024-05-18 NOTE — ED Triage Notes (Signed)
 Pt reports she noticed a small drop of bleeding that started last night and she has swelling around urethra  that started about a month ago yesterday she said it was as big as a plum. She also reports nausea and light headed this morning.

## 2024-05-19 DIAGNOSIS — I951 Orthostatic hypotension: Secondary | ICD-10-CM | POA: Diagnosis not present

## 2024-05-19 DIAGNOSIS — I129 Hypertensive chronic kidney disease with stage 1 through stage 4 chronic kidney disease, or unspecified chronic kidney disease: Secondary | ICD-10-CM | POA: Diagnosis not present

## 2024-05-19 DIAGNOSIS — E2740 Unspecified adrenocortical insufficiency: Secondary | ICD-10-CM | POA: Diagnosis not present

## 2024-05-19 DIAGNOSIS — N1832 Chronic kidney disease, stage 3b: Secondary | ICD-10-CM | POA: Diagnosis not present

## 2024-05-21 ENCOUNTER — Ambulatory Visit (HOSPITAL_BASED_OUTPATIENT_CLINIC_OR_DEPARTMENT_OTHER): Payer: Self-pay | Admitting: Family Medicine

## 2024-05-21 LAB — AEROBIC CULTURE W GRAM STAIN (SUPERFICIAL SPECIMEN): Gram Stain: NONE SEEN

## 2024-05-21 NOTE — Progress Notes (Signed)
 Patient updated on her culture results.  Encouraged to complete the clindamycin and follow-up if needed.

## 2024-06-02 DIAGNOSIS — G90A Postural orthostatic tachycardia syndrome (POTS): Secondary | ICD-10-CM | POA: Diagnosis not present

## 2024-06-02 DIAGNOSIS — E785 Hyperlipidemia, unspecified: Secondary | ICD-10-CM | POA: Diagnosis not present

## 2024-06-02 DIAGNOSIS — N1832 Chronic kidney disease, stage 3b: Secondary | ICD-10-CM | POA: Diagnosis not present

## 2024-06-02 DIAGNOSIS — F419 Anxiety disorder, unspecified: Secondary | ICD-10-CM | POA: Diagnosis not present

## 2024-06-04 DIAGNOSIS — K080 Exfoliation of teeth due to systemic causes: Secondary | ICD-10-CM | POA: Diagnosis not present

## 2024-06-05 DIAGNOSIS — K080 Exfoliation of teeth due to systemic causes: Secondary | ICD-10-CM | POA: Diagnosis not present

## 2024-06-06 DIAGNOSIS — K080 Exfoliation of teeth due to systemic causes: Secondary | ICD-10-CM | POA: Diagnosis not present

## 2024-06-08 DIAGNOSIS — F411 Generalized anxiety disorder: Secondary | ICD-10-CM | POA: Diagnosis not present

## 2024-06-08 DIAGNOSIS — F902 Attention-deficit hyperactivity disorder, combined type: Secondary | ICD-10-CM | POA: Diagnosis not present

## 2024-06-08 DIAGNOSIS — G4701 Insomnia due to medical condition: Secondary | ICD-10-CM | POA: Diagnosis not present

## 2024-06-09 ENCOUNTER — Other Ambulatory Visit: Payer: Self-pay | Admitting: Medical Genetics

## 2024-06-09 DIAGNOSIS — Z006 Encounter for examination for normal comparison and control in clinical research program: Secondary | ICD-10-CM

## 2024-06-10 DIAGNOSIS — K080 Exfoliation of teeth due to systemic causes: Secondary | ICD-10-CM | POA: Diagnosis not present

## 2024-06-17 DIAGNOSIS — H109 Unspecified conjunctivitis: Secondary | ICD-10-CM | POA: Diagnosis not present

## 2024-06-17 DIAGNOSIS — Z1211 Encounter for screening for malignant neoplasm of colon: Secondary | ICD-10-CM | POA: Diagnosis not present

## 2024-06-17 DIAGNOSIS — Z6824 Body mass index (BMI) 24.0-24.9, adult: Secondary | ICD-10-CM | POA: Diagnosis not present

## 2024-06-22 LAB — GENECONNECT MOLECULAR SCREEN: Genetic Analysis Overall Interpretation: NEGATIVE

## 2024-06-23 DIAGNOSIS — K080 Exfoliation of teeth due to systemic causes: Secondary | ICD-10-CM | POA: Diagnosis not present

## 2024-06-24 DIAGNOSIS — E559 Vitamin D deficiency, unspecified: Secondary | ICD-10-CM | POA: Diagnosis not present

## 2024-06-24 DIAGNOSIS — M810 Age-related osteoporosis without current pathological fracture: Secondary | ICD-10-CM | POA: Diagnosis not present

## 2024-06-24 DIAGNOSIS — E2740 Unspecified adrenocortical insufficiency: Secondary | ICD-10-CM | POA: Diagnosis not present

## 2024-06-24 DIAGNOSIS — E038 Other specified hypothyroidism: Secondary | ICD-10-CM | POA: Diagnosis not present

## 2024-07-09 DIAGNOSIS — Z6823 Body mass index (BMI) 23.0-23.9, adult: Secondary | ICD-10-CM | POA: Diagnosis not present

## 2024-07-09 DIAGNOSIS — R197 Diarrhea, unspecified: Secondary | ICD-10-CM | POA: Diagnosis not present

## 2024-07-09 DIAGNOSIS — R109 Unspecified abdominal pain: Secondary | ICD-10-CM | POA: Diagnosis not present

## 2024-07-09 DIAGNOSIS — R195 Other fecal abnormalities: Secondary | ICD-10-CM | POA: Diagnosis not present

## 2024-07-09 DIAGNOSIS — K921 Melena: Secondary | ICD-10-CM | POA: Diagnosis not present

## 2024-07-10 DIAGNOSIS — K921 Melena: Secondary | ICD-10-CM | POA: Diagnosis not present

## 2024-07-23 DIAGNOSIS — A0472 Enterocolitis due to Clostridium difficile, not specified as recurrent: Secondary | ICD-10-CM | POA: Diagnosis not present

## 2024-07-23 DIAGNOSIS — Z6824 Body mass index (BMI) 24.0-24.9, adult: Secondary | ICD-10-CM | POA: Diagnosis not present

## 2024-08-04 DIAGNOSIS — Z6823 Body mass index (BMI) 23.0-23.9, adult: Secondary | ICD-10-CM | POA: Diagnosis not present

## 2024-08-04 DIAGNOSIS — A0472 Enterocolitis due to Clostridium difficile, not specified as recurrent: Secondary | ICD-10-CM | POA: Diagnosis not present

## 2024-08-04 DIAGNOSIS — Z1212 Encounter for screening for malignant neoplasm of rectum: Secondary | ICD-10-CM | POA: Diagnosis not present

## 2024-08-04 DIAGNOSIS — D649 Anemia, unspecified: Secondary | ICD-10-CM | POA: Diagnosis not present

## 2024-08-10 DIAGNOSIS — R197 Diarrhea, unspecified: Secondary | ICD-10-CM | POA: Diagnosis not present

## 2024-08-12 ENCOUNTER — Other Ambulatory Visit: Payer: Self-pay

## 2024-08-12 ENCOUNTER — Other Ambulatory Visit: Payer: Self-pay | Admitting: Hematology and Oncology

## 2024-08-12 ENCOUNTER — Telehealth: Payer: Self-pay | Admitting: Hematology and Oncology

## 2024-08-12 ENCOUNTER — Inpatient Hospital Stay: Attending: Hematology and Oncology | Admitting: Hematology and Oncology

## 2024-08-12 ENCOUNTER — Inpatient Hospital Stay

## 2024-08-12 ENCOUNTER — Encounter: Payer: Self-pay | Admitting: Hematology and Oncology

## 2024-08-12 VITALS — BP 140/92 | HR 62 | Temp 98.3°F | Resp 14 | Ht 64.9 in | Wt 135.3 lb

## 2024-08-12 DIAGNOSIS — N183 Chronic kidney disease, stage 3 unspecified: Secondary | ICD-10-CM | POA: Diagnosis not present

## 2024-08-12 DIAGNOSIS — E039 Hypothyroidism, unspecified: Secondary | ICD-10-CM | POA: Diagnosis not present

## 2024-08-12 DIAGNOSIS — D563 Thalassemia minor: Secondary | ICD-10-CM | POA: Insufficient documentation

## 2024-08-12 DIAGNOSIS — M810 Age-related osteoporosis without current pathological fracture: Secondary | ICD-10-CM | POA: Diagnosis not present

## 2024-08-12 DIAGNOSIS — D649 Anemia, unspecified: Secondary | ICD-10-CM

## 2024-08-12 DIAGNOSIS — Z87891 Personal history of nicotine dependence: Secondary | ICD-10-CM | POA: Insufficient documentation

## 2024-08-12 DIAGNOSIS — Z79899 Other long term (current) drug therapy: Secondary | ICD-10-CM | POA: Diagnosis not present

## 2024-08-12 DIAGNOSIS — D509 Iron deficiency anemia, unspecified: Secondary | ICD-10-CM

## 2024-08-12 LAB — IRON AND TIBC
Iron: 34 ug/dL (ref 28–170)
Saturation Ratios: 13 % (ref 10.4–31.8)
TIBC: 267 ug/dL (ref 250–450)
UIBC: 233 ug/dL

## 2024-08-12 LAB — CBC WITH DIFFERENTIAL (CANCER CENTER ONLY)
Abs Immature Granulocytes: 0.02 K/uL (ref 0.00–0.07)
Basophils Absolute: 0.1 K/uL (ref 0.0–0.1)
Basophils Relative: 1 %
Eosinophils Absolute: 0.3 K/uL (ref 0.0–0.5)
Eosinophils Relative: 3 %
HCT: 34.8 % — ABNORMAL LOW (ref 36.0–46.0)
Hemoglobin: 11.2 g/dL — ABNORMAL LOW (ref 12.0–15.0)
Immature Granulocytes: 0 %
Lymphocytes Relative: 25 %
Lymphs Abs: 2.2 K/uL (ref 0.7–4.0)
MCH: 25.9 pg — ABNORMAL LOW (ref 26.0–34.0)
MCHC: 32.2 g/dL (ref 30.0–36.0)
MCV: 80.6 fL (ref 80.0–100.0)
Monocytes Absolute: 0.6 K/uL (ref 0.1–1.0)
Monocytes Relative: 7 %
Neutro Abs: 5.5 K/uL (ref 1.7–7.7)
Neutrophils Relative %: 64 %
Platelet Count: 228 K/uL (ref 150–400)
RBC: 4.32 MIL/uL (ref 3.87–5.11)
RDW: 13.8 % (ref 11.5–15.5)
WBC Count: 8.7 K/uL (ref 4.0–10.5)
nRBC: 0 % (ref 0.0–0.2)

## 2024-08-12 LAB — CMP (CANCER CENTER ONLY)
ALT: 11 U/L (ref 0–44)
AST: 21 U/L (ref 15–41)
Albumin: 4.1 g/dL (ref 3.5–5.0)
Alkaline Phosphatase: 53 U/L (ref 38–126)
Anion gap: 13 (ref 5–15)
BUN: 14 mg/dL (ref 8–23)
CO2: 23 mmol/L (ref 22–32)
Calcium: 9.2 mg/dL (ref 8.9–10.3)
Chloride: 103 mmol/L (ref 98–111)
Creatinine: 1.39 mg/dL — ABNORMAL HIGH (ref 0.44–1.00)
GFR, Estimated: 43 mL/min — ABNORMAL LOW (ref >60)
Glucose, Bld: 72 mg/dL (ref 70–99)
Potassium: 3.3 mmol/L — ABNORMAL LOW (ref 3.5–5.1)
Sodium: 139 mmol/L (ref 135–145)
Total Bilirubin: 0.3 mg/dL (ref 0.0–1.2)
Total Protein: 7 g/dL (ref 6.5–8.1)

## 2024-08-12 LAB — VITAMIN B12: Vitamin B-12: >4000 pg/mL — ABNORMAL HIGH (ref 180–914)

## 2024-08-12 LAB — FOLATE: Folate: 8.4 ng/mL (ref >5.9)

## 2024-08-12 LAB — TSH: TSH: <0.1 u[IU]/mL — ABNORMAL LOW (ref 0.350–4.500)

## 2024-08-12 LAB — FERRITIN: Ferritin: 177 ng/mL (ref 11–307)

## 2024-08-12 NOTE — Telephone Encounter (Signed)
 Patient has been scheduled for follow-up visit per 08/12/24 LOS.  Pt given an appt calendar with date and time.

## 2024-08-12 NOTE — Progress Notes (Signed)
 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 CBC today reveals hemoglobin 11.2 today. Iron studies are pending. We discussed that we will test for thalassemia as well for confirmation of diagnosis. She will return to clinic in 2 weeks for review of pending labs.    All questions were answered. The patient knows to call the clinic with any problems, questions or concerns.  The total time spent in the appointment was 45 minutes encounter with patients including review of chart and various tests results, discussions about plan of care and coordination of care plan  Eleanor DELENA Bach, NP 9/17/20257:48 AM   CHIEF COMPLAINTS/PURPOSE OF CONSULTATION:  Anemia  HISTORY OF PRESENTING ILLNESS:  Tillie Boutin 63 y.o. female is here because of anemia  She was found to have abnormal CBC from 09/17 She does not have recent chest pain on exertion, shortness of breath on minimal exertion, pre-syncopal episodes, or palpitations. She had not noticed any recent bleeding such as epistaxis, hematuria or hematochezia The patient denies over the counter NSAID ingestion. She is not on antiplatelets agents. Her last colonoscopy was N/A She had no prior history or diagnosis of cancer. Her age appropriate screening programs are up-to-date. She denies any pica and eats a variety of diet. She never donated blood or received blood transfusion The patient was not prescribed oral iron supplements.  MEDICAL HISTORY:  Past Medical History:  Diagnosis Date   Abrasion of left cornea 06/11/2018   Addison's disease (HCC)    Adrenal insufficiency (HCC) 11/07/2012   Formatting of this note might be different from the original.  S/p  bilateral adrenalectomy for pituitary cushing's.     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   Osteoporosis    Pott's disease     SURGICAL HISTORY: Past Surgical History:  Procedure Laterality Date   ABDOMINAL HYSTERECTOMY  1993   ADRENALECTOMY Bilateral 2002   CHOLECYSTECTOMY  2003   FRACTURE SURGERY Left 2005   ankle   PAROTIDECTOMY  1993   PVC ABLATION  2012   TONSILLECTOMY  1977   TUBAL LIGATION  1989   TUMOR EXCISION  2000   pituitary    SOCIAL HISTORY: Social History   Socioeconomic History   Marital status: Single    Spouse name: Not on file   Number of children: Not on file   Years of education: Not on file   Highest education level: Not on file  Occupational History   Not on file  Tobacco Use   Smoking status: Former    Types: Cigarettes   Smokeless tobacco: Never  Vaping Use   Vaping status: Never Used  Substance and Sexual Activity   Alcohol use: Never   Drug use: Never   Sexual activity: Not on file  Other Topics Concern   Not on file  Social History Narrative   Are you right handed or left  handed? Right   Are you currently employed ? no   What is your current occupation?   Do you live at home alone? daughter   Who lives with you?    What type of home do you live in: 1 story or 2 story? two    Caffeine 1 cup daily   Social Drivers of Health   Financial Resource Strain: Not on file  Food Insecurity: High Risk (01/07/2023)   Received from [facility]   Hunger Vital Sign    Within the past 12 months, you worried that your food would run out before you got money to buy more: Often true    Within the past 12 months, the  food you bought just didn't last and you didn't have money to get more: Not on file  Transportation Needs: No Transportation Needs (01/07/2023)   Received from [facility]    In the past 12 months, has lack of reliable transportation kept you from medical appointments, meetings, work or from getting things needed for daily living? : No  Physical Activity: Not on file  Stress: Not on file  Social Connections: Not on file  Intimate Partner Violence: Not on file    FAMILY HISTORY: Family History  Problem Relation Age of Onset   Thyroid  disease Mother    Lung cancer Mother    Liver cancer Mother    Congestive Heart Failure Father    Stroke Maternal Grandmother     ALLERGIES:  is allergic to nortriptyline  hcl, codeine, cymbalta [duloxetine hcl], depakote [divalproex sodium], duloxetine, lamictal [lamotrigine], lyrica [pregabalin], sumatriptan, tegretol [carbamazepine], toprol  xl [metoprolol ], trazodone and nefazodone, wellbutrin [bupropion], zonisamide, and tape.  MEDICATIONS:  Current Outpatient Medications  Medication Sig Dispense Refill   ALPRAZolam (XANAX) 0.25 MG tablet Take 0.25 mg by mouth at bedtime as needed for anxiety.     aspirin EC 81 MG tablet Take 81 mg by mouth daily. Swallow whole.     Calcium Carbonate-Vitamin D ([employer]S CALCIUM/VIT D SOFT CHEWS PO) Take 5,000 capsules by mouth every 30 (thirty) days.     clotrimazole-betamethasone (LOTRISONE) cream Apply 1 application topically as needed (fungus).     fluconazole  (DIFLUCAN ) 150 MG tablet Take 1 tablet by mouth today and repeat every 5 days for vaginal yeast infection.  May use up to four pills, if needed 4 tablet 0   FLUDROCORTISONE ACETATE PO Take 0.1 mg by mouth daily. Take 1/2 tablet daily     hydrocortisone (CORTEF) 5 MG tablet Take 5 mg by mouth 2 (two) times daily.     lansoprazole (PREVACID) 15 MG capsule Take 15 mg by mouth daily at 12 noon.     levothyroxine (SYNTHROID) 88 MCG tablet Take 88 mcg  by mouth daily before breakfast.     liothyronine (CYTOMEL) 5 MCG tablet Take 2.5 mcg by mouth daily.     Methylcobalamin (B12-ACTIVE) 1 MG CHEW Chew 1 m by mouth daily.     Multiple Vitamin (MULTIVITAMIN) tablet Take 1 tablet by mouth daily.     mupirocin  ointment (BACTROBAN ) 2 % Apply 1 Application topically 2 (two) times daily. 22 g 0   ondansetron  (ZOFRAN ) 4 MG tablet Take 4 mg by mouth every 8 (eight) hours as needed for nausea or vomiting.     pravastatin  (PRAVACHOL ) 20 MG tablet Take 1 tablet (20 mg total) by mouth daily. 90 tablet 2   Rimegepant Sulfate (NURTEC) 75 MG TBDP Take 1 tablet (75 mg total) by mouth as  needed. (Patient taking differently: Take 75 mg by mouth as needed (Migraine).) 10 tablet 5   Vitamin D, Ergocalciferol, (DRISDOL) 1.25 MG (50000 UNIT) CAPS capsule Take 50,000 Units by mouth every 30 (thirty) minutes.     No current facility-administered medications for this visit.    REVIEW OF SYSTEMS:   Constitutional: Denies fevers, chills or abnormal night sweats Eyes: Denies blurriness of vision, double vision or watery eyes Ears, nose, mouth, throat, and face: Denies mucositis or sore throat Respiratory: Denies cough, dyspnea or wheezes Cardiovascular: Denies palpitation, chest discomfort or lower extremity swelling Gastrointestinal:  Denies nausea, heartburn or change in bowel habits Skin: Denies abnormal skin rashes Lymphatics: Denies new lymphadenopathy or easy bruising Neurological:Denies numbness, tingling or new weaknesses Behavioral/Psych: Mood is stable, no new changes  All other systems were reviewed with the patient and are negative.  PHYSICAL EXAMINATION: ECOG PERFORMANCE STATUS: 0 - Asymptomatic  There were no vitals filed for this visit. There were no vitals filed for this visit.  GENERAL:alert, no distress and comfortable SKIN: skin color, texture, turgor are normal, no rashes or significant lesions EYES: normal, conjunctiva are pink and  non-injected, sclera clear OROPHARYNX:no exudate, no erythema and lips, buccal mucosa, and tongue normal  NECK: supple, thyroid  normal size, non-tender, without nodularity LYMPH:  no palpable lymphadenopathy in the cervical, axillary or inguinal LUNGS: clear to auscultation and percussion with normal breathing effort HEART: regular rate & rhythm and no murmurs and no lower extremity edema ABDOMEN:abdomen soft, non-tender and normal bowel sounds Musculoskeletal:no cyanosis of digits and no clubbing  PSYCH: alert & oriented x 3 with fluent speech NEURO: no focal motor/sensory deficits  RADIOGRAPHIC STUDIES: I have personally reviewed the radiological images as listed and agreed with the findings in the report. No results found.

## 2024-08-14 LAB — PROTEIN ELECTROPHORESIS, SERUM
A/G Ratio: 1.3 (ref 0.7–1.7)
Albumin ELP: 3.7 g/dL (ref 2.9–4.4)
Alpha-1-Globulin: 0.3 g/dL (ref 0.0–0.4)
Alpha-2-Globulin: 0.8 g/dL (ref 0.4–1.0)
Beta Globulin: 0.9 g/dL (ref 0.7–1.3)
Gamma Globulin: 0.7 g/dL (ref 0.4–1.8)
Globulin, Total: 2.8 g/dL (ref 2.2–3.9)
Total Protein ELP: 6.5 g/dL (ref 6.0–8.5)

## 2024-08-15 LAB — MULTIPLE MYELOMA PANEL, SERUM
Albumin SerPl Elph-Mcnc: 3.7 g/dL (ref 2.9–4.4)
Albumin/Glob SerPl: 1.4 (ref 0.7–1.7)
Alpha 1: 0.3 g/dL (ref 0.0–0.4)
Alpha2 Glob SerPl Elph-Mcnc: 0.8 g/dL (ref 0.4–1.0)
B-Globulin SerPl Elph-Mcnc: 0.9 g/dL (ref 0.7–1.3)
Gamma Glob SerPl Elph-Mcnc: 0.7 g/dL (ref 0.4–1.8)
Globulin, Total: 2.7 g/dL (ref 2.2–3.9)
IgA: 170 mg/dL (ref 87–352)
IgG (Immunoglobin G), Serum: 736 mg/dL (ref 586–1602)
IgM (Immunoglobulin M), Srm: 42 mg/dL (ref 26–217)
Total Protein ELP: 6.4 g/dL (ref 6.0–8.5)

## 2024-08-16 LAB — HGB FRACTIONATION CASCADE
Hgb A2: 2.2 % (ref 1.8–3.2)
Hgb A: 97.8 % (ref 96.4–98.8)
Hgb F: 0 % (ref 0.0–2.0)
Hgb S: 0 %

## 2024-08-20 LAB — ALPHA-THALASSEMIA ANALYSIS: IMAGE: 0

## 2024-08-25 ENCOUNTER — Telehealth: Payer: Self-pay | Admitting: Hematology and Oncology

## 2024-08-25 ENCOUNTER — Other Ambulatory Visit: Payer: Self-pay

## 2024-08-25 ENCOUNTER — Other Ambulatory Visit: Payer: Self-pay | Admitting: Hematology and Oncology

## 2024-08-25 ENCOUNTER — Inpatient Hospital Stay (HOSPITAL_BASED_OUTPATIENT_CLINIC_OR_DEPARTMENT_OTHER): Admitting: Hematology and Oncology

## 2024-08-25 VITALS — BP 138/76 | HR 60 | Temp 98.3°F | Resp 14 | Ht 65.9 in | Wt 137.9 lb

## 2024-08-25 DIAGNOSIS — D509 Iron deficiency anemia, unspecified: Secondary | ICD-10-CM | POA: Diagnosis not present

## 2024-08-25 DIAGNOSIS — D649 Anemia, unspecified: Secondary | ICD-10-CM | POA: Diagnosis not present

## 2024-08-25 DIAGNOSIS — D563 Thalassemia minor: Secondary | ICD-10-CM | POA: Diagnosis not present

## 2024-08-25 DIAGNOSIS — Z87891 Personal history of nicotine dependence: Secondary | ICD-10-CM | POA: Diagnosis not present

## 2024-08-25 DIAGNOSIS — Z79899 Other long term (current) drug therapy: Secondary | ICD-10-CM | POA: Diagnosis not present

## 2024-08-25 DIAGNOSIS — N183 Chronic kidney disease, stage 3 unspecified: Secondary | ICD-10-CM | POA: Diagnosis not present

## 2024-08-25 DIAGNOSIS — M810 Age-related osteoporosis without current pathological fracture: Secondary | ICD-10-CM | POA: Diagnosis not present

## 2024-08-25 DIAGNOSIS — E039 Hypothyroidism, unspecified: Secondary | ICD-10-CM | POA: Diagnosis not present

## 2024-08-25 NOTE — Telephone Encounter (Signed)
 Patient has been scheduled for follow-up visit per 08/24/24 LOS.  Pt given an appt calendar with date and time.

## 2024-08-26 DIAGNOSIS — Z01419 Encounter for gynecological examination (general) (routine) without abnormal findings: Secondary | ICD-10-CM | POA: Diagnosis not present

## 2024-08-26 DIAGNOSIS — Z1231 Encounter for screening mammogram for malignant neoplasm of breast: Secondary | ICD-10-CM | POA: Diagnosis not present

## 2024-08-26 NOTE — Progress Notes (Signed)
 HPI:   63 y.o. [id number] female, No LMP recorded., who presents for pelvic exam, not had one for years. No complaints.  Medical History[1]  Surgical History[2]  Social History[3]  Family History[4]  OB History  Gravida Para Term Preterm AB Living  6 4   2 4   SAB IAB Ectopic Molar Multiple Live Births  2     4    # Outcome Date GA Lbr Len/2nd Weight Sex Type Anes PTL Lv  6 SAB           5 SAB           4 Para           3 Para           2 Para           1 Para             Allergies[5]  Current Medications[6]  Review of Systems:  Denies breast, bowel, urinary, and vaginal symptoms.   Other systems negative except as noted in HPI.  EXAM: BP 130/70   Ht 1.626 m (5' 4)   Wt 62.1 kg (137 lb)   BMI 23.52 kg/m , Body mass index is 23.52 kg/m. Well developed, Normal Weight female Normal grooming, mood, affect, and orientation Skin without significant lesions Abdomen soft without tenderness, mass, hepatosplenomegaly, or hernia No inguinal adenopathy noted Normal female external genitalia Urethra, meatus, and bladder normal Vagina without lesion; physiologic discharge, darkened area noted, hyperpigmented Normal pelvic support Cervix surgically absent  Uterus surgically absent surgically absent Adnexa without mass, tenderness, fullness Anus & perineum without lesion Rectal not indicated  Chaperone present for exam  IMPRESSION: 63 y.o. [id number] 1. Women's annual routine gynecological examination      2. Encounter for screening mammogram for malignant neoplasm of breast      .  PLAN:  Rto in 6 months, if are changed would recommend bx       [1] Past Medical History: Diagnosis Date  . Allergy   . Anxiety   . Chronic kidney disease   . Hypercholesterolemia   . Hypopituitarism (CMD)   . Osteoporosis   . Pituitary adenoma    (CMD)   . Thyroid  disease   . Thyroid  nodule   . Vitamin D deficiency   [2] Past Surgical History: Procedure Laterality Date  .  ADRENALECTOMY Bilateral 2005   Procedure: ADRENALECTOMY  . CARDIAC ELECTROPHYSIOLOGY STUDY AND ABLATION  2012   Procedure: ABLATION A FLUTTER LEFT  . CHOLECYSTECTOMY  2002   Procedure: CHOLECYSTECTOMY  . DILATION AND CURETTAGE OF UTERUS  1988   Procedure: DILATION AND CURETTAGE OF UTERUS  . HYSTERECTOMY   1993   Procedure: HYSTERECTOMY  . ORIF ACETABULUM FRACTURE Left 2005   Procedure: ORIF ACETABULUM FRACTURE; Ankle  . ORIF ANKLE FRACTURE     Procedure: ORIF ANKLE FRACTURE  . PAROTIDECTOMY  1993   Procedure: PAROTIDECTOMY  . TONSILLECTOMY  10/1970   Procedure: TONSILLECTOMY  . TRANSPHENOIDAL / TRANSNASAL HYPOPHYSECTOMY / RESECTION PITUITARY TUMOR  2000   Procedure: TRANSPHENOIDAL / TRANSNASAL HYPOPHYSECTOMY / RESECTION PITUITARY TUMOR  . TUBAL LIGATION     Procedure: TUBAL LIGATION  [3] Social History Socioeconomic History  . Marital status: Divorced  Tobacco Use  . Smoking status: Former  . Smokeless tobacco: Never  Substance and Sexual Activity  . Alcohol use: No  . Drug use: No  . Sexual activity: Not Currently   Social Drivers of Health   Food Insecurity:  High Risk (01/07/2023)   Received from [facility] visits prior to 01/26/2023.   Food   . Within the past 12 months, you worried that your food would run out before you got money to buy more food: Often true   . Within the past 12 months, the food you bought just didn't last and you didn't have money to get more: Sometimes true  Transportation Needs: No Transportation Needs (01/07/2023)   Received from [facility] visits prior to 01/26/2023.   Transportation   . In the past 12 months, has lack of reliable transportation kept you from medical appointments, meetings, work or from getting things needed for daily living?: No  Living Situation: Medium Risk (01/07/2023)   Received from [facility] visits prior to 01/26/2023.   Living Needs   . What is your living  situation today?: I have a place to live today, but I am worried about losing it in the future   . Think about the place you live.  Do you have problems with any of the following? (Choose all that apply): None of the above  [4] Family History Problem Relation Name Age of Onset  . Cancer Mother         LUNG  . Heart disease Father    . Hypertension Father    . Heart failure Father    . Kidney failure Neg Hx    [5] Allergies Allergen Reactions  . Surgical Tape Rash    Adhesive on tape --Blisters  . Alprazolam Swelling  . Bupropion Hcl Hypertension  . Codeine GI Intolerance    Stomach upset  . Divalproex GI Intolerance    Abnormal heartbeat  . Duloxetine Diarrhea and GI Intolerance  . Lamotrigine Itching  . Metoprolol  Succinate Other (See Comments)    unspecified  . Nortriptyline  Angioedema  . Pregabalin Other (See Comments)    Suicidal thoughts  . Sumatriptan Other (See Comments)    Numbness and pain  . Trazodone Other (See Comments)  . Zonisamide Other (See Comments)    Migraines  . Adhesive Rash  . Carbamazepine Rash  . Dye Rash    IVP Dye  . Simvastatin Rash  [6] Current Outpatient Medications  Medication Sig Dispense Refill  . alendronate (FOSAMAX) 70 mg tablet Take 1 tablet (70 mg total) by mouth every 7 days. In morning w/ full glass of water on an empty stomach,don't lie down x30 Min 12 tablet 3  . aspirin 81 mg EC tablet Take  by mouth Once Daily.    . cholecalciferol (VITAMIN D3) 1,250 mcg (50,000 unit) capsule Take 1,250 mcg by mouth every 30 (thirty) days.    . coenzyme Q-10 10 mg capsule TAKING 1 TABLET NIGHTLY    . cyanocobalamin (VITAMIN B12) 1,000 mcg tablet Take 5,000 mcg by mouth Once Daily.    SABRA EPINEPHrine (ADRENALIN) 1 mg/mL kit injection Inject as directed as needed.    . ergocalciferol (VITAMIN D2) 1,250 mcg (50,000 unit) capsule TAKE 1 CAPSULE EVERY MONTH 12 capsule 1  . fluconazole  (DIFLUCAN ) 150 mg tablet Take 1 tablet by mouth today and repeat  every 5 days for vaginal yeast infection.  May use up to four pills, if needed    . fludrocortisone (FLORINEF) 0.1 mg tablet TAKE ONE-HALF (1/2) TABLET DAILY 45 tablet 3  . hydrocortisone (CORTEF) 5 mg tablet TAKE 2 TABLETS IN THE MORNING AND 1 TABLET AT 3 P.M. EXTRA TABLETS AS PATIENT NEEDS  TO DOUBLE DOSE IF SICK 400 tablet 4  . hydrocortisone sodium succinate, PF, (Solu-CORTEF Act-O-Vial, PF,) 100 mg/2 mL solr Inject 2 mL (100 mg total) into the muscle once as needed (adrenal insufficiency). 2 each 1  . levothyroxine (SYNTHROID) 75 mcg tablet Take 1 tablet (75 mcg total) by mouth daily. 90 tablet 3  . liothyronine (CYTOMEL) 5 mcg tablet Take 0.5 tablets (2.5 mcg total) by mouth daily. 45 tablet 3  . LORazepam (Ativan) 1 mg tablet Take 1 tablet (1 mg total) by mouth at bedtime. 90 tablet 1  . nystatin (MYCOSTATIN) cream Apply 1 Application topically as needed.    SABRA omega 3-dha-epa-fish oil (OMEGA 3) 1,000 mg DR capsule Take 1 capsule by mouth daily.    . ondansetron  (ZOFRAN ) 4 mg tablet Take 4 mg by mouth as needed.    . pravastatin  (PRAVACHOL ) 20 mg tablet Take 20 mg by mouth nightly.    . sertraline (ZOLOFT) 100 mg tablet Take 1 tablet (100 mg total) by mouth daily. 90 tablet 3  . sodium bicarbonate 650 mg tablet Take 1 tablet (650 mg total) by mouth 2 (two) times a day. 180 tablet 1  . hydrocortisone (CORTEF) 5 mg tablet TAKING 2 TABLETS IN THE AM AND 1 TABLET AT 3 PM. Please dispense extra tablets as patient need to double dose if she is sick. 400 tablet 1  . mupirocin  (BACTROBAN ) 2 % ointment  (Patient not taking: Reported on 08/26/2024)     No current facility-administered medications for this visit.

## 2024-08-31 DIAGNOSIS — M25561 Pain in right knee: Secondary | ICD-10-CM | POA: Diagnosis not present

## 2024-09-01 DIAGNOSIS — M25561 Pain in right knee: Secondary | ICD-10-CM | POA: Diagnosis not present

## 2024-09-03 DIAGNOSIS — M810 Age-related osteoporosis without current pathological fracture: Secondary | ICD-10-CM | POA: Diagnosis not present

## 2024-09-03 DIAGNOSIS — E240 Pituitary-dependent Cushing's disease: Secondary | ICD-10-CM | POA: Diagnosis not present

## 2024-09-03 DIAGNOSIS — E2740 Unspecified adrenocortical insufficiency: Secondary | ICD-10-CM | POA: Diagnosis not present

## 2024-09-03 DIAGNOSIS — E038 Other specified hypothyroidism: Secondary | ICD-10-CM | POA: Diagnosis not present

## 2024-09-15 NOTE — Progress Notes (Signed)
  [facility]cer Center CONSULT NOTE  Patient Care Team: Allen, Chad, NP as PCP - General (Nurse Practitioner) Leigh Venetia CROME, MD as Consulting Physician (Neurology)  ASSESSMENT & PLAN:  Anemia: She has been noted to have mild anemia in the past and has been told she has thalassemia trait, but is unsure. CBC today reveals hemoglobin 11.2. All pending labs are negative for concerns of thalassemia trait. Iron studies are within normal limits, as well. She will return to clinic in 3 months for repeat evaluation.    All questions were answered. The patient knows to call the clinic with any problems, questions or concerns.  The total time spent in the appointment was 45 minutes encounter with patients including review of chart and various tests results, discussions about plan of care and coordination of care plan  Denise DELENA Bach, NP 11/3/202512:24 PM   CHIEF COMPLAINTS/PURPOSE OF CONSULTATION:  Anemia  HISTORY OF PRESENTING ILLNESS:  Denise Foley 63 y.o. female is here because of anemia  She was found to have abnormal CBC from 09/17 She does not have recent chest pain on exertion, shortness of breath on minimal exertion, pre-syncopal episodes, or palpitations. She had not noticed any recent bleeding such as epistaxis, hematuria or hematochezia The patient denies over the counter NSAID ingestion. She is not on antiplatelets agents. Her last colonoscopy was N/A She had no prior history or diagnosis of cancer. Her age appropriate screening programs are up-to-date. She denies any pica and eats a variety of diet. She never donated blood or received blood transfusion The patient was not prescribed oral iron supplements.  MEDICAL HISTORY:  Past Medical History:  Diagnosis Date   Abrasion of left cornea 06/11/2018   Addison's disease [facility])    Adrenal insufficiency 11/07/2012   Formatting of this note might be different from the original.  S/p bilateral adrenalectomy for pituitary  cushing's.     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   Osteoporosis    Pott's disease     SURGICAL HISTORY: Past Surgical History:  Procedure Laterality Date   ABDOMINAL HYSTERECTOMY  1993   ADRENALECTOMY Bilateral 2002   CHOLECYSTECTOMY  2003   FRACTURE SURGERY Left 2005   ankle   PAROTIDECTOMY  1993   PVC ABLATION  2012   TONSILLECTOMY  1977   TUBAL LIGATION  1989   TUMOR EXCISION  2000   pituitary    SOCIAL HISTORY: Social History   Socioeconomic History   Marital status: Single    Spouse name: Not on file   Number of children: Not on file   Years of education: Not on file   Highest education level: Not on file  Occupational History   Not on file  Tobacco Use   Smoking status: Former    Types: Cigarettes   Smokeless tobacco: Never  Vaping Use   Vaping status: Never Used  Substance and Sexual Activity   Alcohol use: Not Currently   Drug use: Not Currently    Comment: As a teen, back in the 15s   Sexual activity: Not on file  Other Topics Concern   Not on file  Social History Narrative  Are you right handed or left handed? Right   Are you currently employed ? no   What is your current occupation?   Do you live at home alone? daughter   Who lives with you?    What type of home do you live in: 1 story or 2 story? two    Caffeine 1 cup daily   Social Drivers of Corporate Investment Banker Strain: Not on file  Food Insecurity: High Risk (01/07/2023)   Received from [facility]   Hunger Vital Sign    Within the past 12 months, you worried that your food would run out before you got money to buy more: Often true    Within the  past 12 months, the food you bought just didn't last and you didn't have money to get more: Not on file  Transportation Needs: No Transportation Needs (08/12/2024)   PRAPARE - Administrator, Civil Service (Medical): No    Lack of Transportation (Non-Medical): No  Physical Activity: Not on file  Stress: Not on file  Social Connections: Not on file  Intimate Partner Violence: Not At Risk (08/12/2024)   Humiliation, Afraid, Rape, and Kick questionnaire    Fear of Current or Ex-Partner: No    Emotionally Abused: No    Physically Abused: No    Sexually Abused: No    FAMILY HISTORY: Family History  Problem Relation Age of Onset   Thyroid  disease Mother    Lung cancer Mother    Liver cancer Mother    Congestive Heart Failure Father    Stroke Maternal Grandmother     ALLERGIES:  is allergic to nortriptyline  hcl, codeine, cymbalta [duloxetine hcl], depakote [divalproex sodium], duloxetine, lamictal [lamotrigine], lyrica [pregabalin], sumatriptan, tegretol [carbamazepine], toprol  xl [metoprolol ], trazodone and nefazodone, wellbutrin [bupropion], zonisamide, and tape.  MEDICATIONS:  Current Outpatient Medications  Medication Sig Dispense Refill   alendronate (FOSAMAX) 70 MG tablet Take 70 mg by mouth once a week.     aspirin EC 81 MG tablet Take 81 mg by mouth daily. Swallow whole.     clotrimazole-betamethasone (LOTRISONE) cream Apply 1 application topically as needed (fungus).     FLUDROCORTISONE ACETATE PO Take 0.1 mg by mouth daily. Take 1/2 tablet daily     hydrocortisone (CORTEF) 5 MG tablet Take 5 mg by mouth 2 (two) times daily.     lansoprazole (PREVACID) 15 MG capsule Take 15 mg by mouth daily at 12 noon.     levothyroxine (SYNTHROID) 88 MCG tablet Take 88 mcg by mouth daily before breakfast.     liothyronine (CYTOMEL) 5 MCG tablet Take 2.5 mcg by mouth daily.     LORazepam (ATIVAN) 1 MG tablet Take 1 mg by mouth at bedtime.     Methylcobalamin (B12-ACTIVE) 1 MG CHEW  Chew 1 m by mouth daily.     ondansetron  (ZOFRAN ) 4 MG tablet Take 4 mg by mouth every 8 (eight) hours as needed for nausea or vomiting.     pravastatin  (PRAVACHOL ) 20 MG tablet Take 1 tablet (20 mg total) by mouth daily. 90 tablet 2   Rimegepant Sulfate (NURTEC) 75 MG TBDP Take 1 tablet (75 mg total) by mouth as needed. (Patient taking differently: Take 75 mg by mouth as needed (Migraine).) 10 tablet 5   sertraline (ZOLOFT) 100 MG tablet Take 100 mg by mouth daily.     Vitamin D, Ergocalciferol, (DRISDOL) 1.25 MG (50000 UNIT) CAPS capsule Take 50,000 Units by mouth every 30 (thirty) minutes.  Calcium Carbonate-Vitamin D ([employer] CALCIUM/VIT D SOFT CHEWS PO) Take 5,000 capsules by mouth every 30 (thirty) days. (Patient not taking: Reported on 08/25/2024)     mupirocin  ointment (BACTROBAN ) 2 % Apply 1 Application topically 2 (two) times daily. (Patient not taking: Reported on 08/25/2024) 22 g 0   No current facility-administered medications for this visit.    REVIEW OF SYSTEMS:   Constitutional: Denies fevers, chills or abnormal night sweats Eyes: Denies blurriness of vision, double vision or watery eyes Ears, nose, mouth, throat, and face: Denies mucositis or sore throat Respiratory: Denies cough, dyspnea or wheezes Cardiovascular: Denies palpitation, chest discomfort or lower extremity swelling Gastrointestinal:  Denies nausea, heartburn or change in bowel habits Skin: Denies abnormal skin rashes Lymphatics: Denies new lymphadenopathy or easy bruising Neurological:Denies numbness, tingling or new weaknesses Behavioral/Psych: Mood is stable, no new changes  All other systems were reviewed with the patient and are negative.  PHYSICAL EXAMINATION: ECOG PERFORMANCE STATUS: 0 - Asymptomatic  Vitals:   08/25/24 0931  BP: 138/76  Pulse: 60  Resp: 14  Temp: 98.3 F (36.8 C)  SpO2: 98%   Filed Weights   08/25/24 0931  Weight: 137 lb 14.4 oz (62.6 kg)    GENERAL:alert, no distress and  comfortable SKIN: skin color, texture, turgor are normal, no rashes or significant lesions EYES: normal, conjunctiva are pink and non-injected, sclera clear OROPHARYNX:no exudate, no erythema and lips, buccal mucosa, and tongue normal  NECK: supple, thyroid  normal size, non-tender, without nodularity LYMPH:  no palpable lymphadenopathy in the cervical, axillary or inguinal LUNGS: clear to auscultation and percussion with normal breathing effort HEART: regular rate & rhythm and no murmurs and no lower extremity edema ABDOMEN:abdomen soft, non-tender and normal bowel sounds Musculoskeletal:no cyanosis of digits and no clubbing  PSYCH: alert & oriented x 3 with fluent speech NEURO: no focal motor/sensory deficits  RADIOGRAPHIC STUDIES: I have personally reviewed the radiological images as listed and agreed with the findings in the report. No results found. Orders Placed This Encounter  Procedures   Ambulatory referral to Endocrinology    Referral Priority:   Urgent    Referral Type:   Consultation    Referral Reason:   Specialty Services Required    Number of Visits Requested:   1    All questions were answered. The patient knows to call the clinic with any problems, questions or concerns.  The total time spent in the appointment was 20 minutes encounter with patients including review of chart and various tests results, discussions about plan of care and coordination of care plan  Denise DELENA Bach, NP 08/25/24

## 2024-09-28 ENCOUNTER — Other Ambulatory Visit: Payer: Self-pay | Admitting: Cardiology

## 2024-09-28 DIAGNOSIS — K219 Gastro-esophageal reflux disease without esophagitis: Secondary | ICD-10-CM | POA: Diagnosis not present

## 2024-09-28 DIAGNOSIS — N1832 Chronic kidney disease, stage 3b: Secondary | ICD-10-CM

## 2024-09-28 DIAGNOSIS — I951 Orthostatic hypotension: Secondary | ICD-10-CM

## 2024-09-28 DIAGNOSIS — I493 Ventricular premature depolarization: Secondary | ICD-10-CM

## 2024-10-01 ENCOUNTER — Other Ambulatory Visit: Payer: Self-pay

## 2024-10-01 DIAGNOSIS — E240 Pituitary-dependent Cushing's disease: Secondary | ICD-10-CM | POA: Insufficient documentation

## 2024-10-01 NOTE — Progress Notes (Unsigned)
 Cardiology Office Note:    Date:  10/02/2024   ID:  Denise Foley, DOB December 20, 1960, MRN [id number]  PCP:  Allen, Chad, NP  Cardiologist:  Redell Leiter, MD    Referring MD: Cristela Nest Key, *    ASSESSMENT:    1. Orthostatic hypotension   2. Addison's disease (HCC)   3. PVC's (premature ventricular contractions)   4. Stage 3b chronic kidney disease (HCC)   5. Elevated blood pressure reading    PLAN:    In order of problems listed above:  Cardiology perspective Denise Foley is doing well stable with her adrenal replacement therapy Stable PVCs none her EKG and not having severe symptoms Stable CKD With blood blood pressure has been on target in my office today in particular is not on antihypertensive agents   Next appointment: 1 year   Medication Adjustments/Labs and Tests Ordered: Current medicines are reviewed at length with the patient today.  Concerns regarding medicines are outlined above.  Orders Placed This Encounter  Procedures   EKG 12-Lead   No orders of the defined types were placed in this encounter.    History of Present Illness:    Denise Foley is a 63 y.o. female with a hx of  orthostatic hypotension Addison's disease with adrenal hormone replacement PVCs stage IIIb CKD and atherosclerosis of the coronary arteries with noncalcified plaque coronary calcium score of 0  last seen 10/03/2023.  Compliance with diet, lifestyle and medications: Yes this  She has had a rough year she has C. difficile colitis iron deficiency anemia she has recovered and she is pending colonoscopy Not having palpitation edema shortness of breath or syncope Tolerates his statin without muscle pain or weakness.  Lipid profile 02/07/2024 cholesterol 172 LDL 66 non-HDL cholesterol  95 Past Medical History:  Diagnosis Date   Abrasion of left cornea 06/11/2018   Addison's disease [facility])    Adrenal insufficiency 11/07/2012   Formatting of this note might be different from the original.  S/p  bilateral adrenalectomy for pituitary cushing's.     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   aspirin EC 81 MG tablet Take 81 mg by mouth daily. Swallow whole.   clotrimazole-betamethasone (LOTRISONE) cream Apply 1 application topically as needed (fungus).   FLUDROCORTISONE ACETATE PO Take 0.1 mg by mouth daily. Take 1/2 tablet daily   hydrocortisone (CORTEF) 5 MG tablet Take 5 mg by mouth 2 (two) times daily.   lansoprazole (PREVACID) 15 MG capsule Take 15 mg by mouth daily at 12 noon.   levothyroxine (SYNTHROID) 75 MCG tablet Take 75 mcg by mouth daily.   liothyronine (CYTOMEL) 5 MCG tablet Take 2.5 mcg by mouth daily.   LORazepam (ATIVAN) 1 MG tablet Take 1 mg by mouth at bedtime.   Methylcobalamin (B12-ACTIVE) 1 MG CHEW Chew 1 m by mouth daily.   ondansetron  (ZOFRAN ) 4 MG tablet Take 4 mg by mouth every 8 (eight) hours as needed for nausea or vomiting.   pravastatin  (  PRAVACHOL ) 20 MG tablet Take 1 tablet (20 mg total) by mouth daily.   Rimegepant Sulfate (NURTEC) 75 MG TBDP Take 1 tablet (75 mg total) by mouth as needed. (Patient taking differently: Take 75 mg by mouth as needed (Migraine).)   sertraline (ZOLOFT) 100 MG tablet Take 100 mg by mouth daily.   Vitamin D, Ergocalciferol, (DRISDOL) 1.25 MG (50000 UNIT) CAPS capsule Take 50,000 Units by mouth every 30 (thirty) minutes.      EKGs/Labs/Other Studies Reviewed:    The following studies were reviewed today:  Cardiac Studies & Procedures    ______________________________________________________________________________________________     ECHOCARDIOGRAM  ECHOCARDIOGRAM COMPLETE 06/19/2021  Narrative ECHOCARDIOGRAM REPORT    Patient Name:   Denise Foley Date of Exam: 06/19/2021 Medical Rec #:  [id number]  Height:       64.0 in Accession #:    [id number] Weight:       146.0 lb Date of Birth:  1961/11/02  BSA:          1.711 m Patient Age:    60 years   BP:           105/68 mmHg Patient Gender: F          HR:           65 bpm. Exam Location:  [place]  Procedure: 2D Echo  Indications:    PVC's (premature ventricular contractions) [I49.3 (ICD-10-CM)]; Orthostatic hypotension [I95.1 (ICD-10-CM)]; Stage 3b chronic kidney disease (HCC) [W81.67 (ICD-10-CM)]; Chest pain of uncertain etiology [R07.9 (ICD-10-CM)]  History:        Patient has prior history of Echocardiogram examinations, most recent 08/29/2011.  Sonographer:    Lynwood Silvas Referring Phys: [phone] Anhar Mcdermott J Masaichi Kracht  IMPRESSIONS   1. Left ventricular ejection fraction, by estimation, is 60 to 65%. The left ventricle has normal function. The left ventricle has no regional wall motion abnormalities. Left ventricular diastolic parameters were normal. 2. Right ventricular systolic function is normal. The right ventricular size is normal. There is normal pulmonary artery systolic pressure. 3. The mitral valve is normal in structure. Mild mitral valve regurgitation. No evidence of mitral stenosis. 4. The aortic valve is normal in structure. Aortic valve regurgitation is not visualized. No aortic stenosis is present. 5. The inferior vena cava is normal in size with greater than 50% respiratory variability, suggesting right atrial pressure of 3 mmHg.  FINDINGS Left Ventricle: Left ventricular ejection fraction, by estimation, is 60 to 65%. The left ventricle has normal function. The left ventricle has no regional wall motion abnormalities. The left ventricular internal  cavity size was normal in size. There is no left ventricular hypertrophy. Left ventricular diastolic parameters were normal.  Right Ventricle: The right ventricular size is normal. No increase in right ventricular wall thickness. Right ventricular systolic function is normal. There is normal pulmonary artery systolic pressure. The tricuspid regurgitant velocity is 1.78 m/s, and with an assumed right atrial pressure of 3 mmHg, the estimated right ventricular systolic pressure is 15.7 mmHg.  Left Atrium: Left atrial size was normal in size.  Right Atrium: Right atrial size was normal in size.  Pericardium: There is no evidence of pericardial effusion.  Mitral Valve: The mitral valve is normal in structure. Mild mitral valve regurgitation. No evidence of mitral valve stenosis.  Tricuspid Valve: The tricuspid valve is normal in structure. Tricuspid valve regurgitation is mild . No evidence of tricuspid stenosis.  Aortic Valve: The aortic valve is normal in structure. Aortic valve regurgitation is not visualized.  No aortic stenosis is present.  Pulmonic Valve: The pulmonic valve was normal in structure. Pulmonic valve regurgitation is not visualized. No evidence of pulmonic stenosis.  Aorta: The aortic root is normal in size and structure.  Venous: The inferior vena cava is normal in size with greater than 50% respiratory variability, suggesting right atrial pressure of 3 mmHg.  IAS/Shunts: No atrial level shunt detected by color flow Doppler.   LEFT VENTRICLE PLAX 2D LVIDd:         4.10 cm  Diastology LVIDs:         2.40 cm  LV e' medial:    5.77 cm/s LV PW:         0.90 cm  LV E/e' medial:  14.3 LV IVS:        0.90 cm  LV e' lateral:   11.20 cm/s LVOT diam:     1.90 cm  LV E/e' lateral: 7.4 LV SV:         50 LV SV Index:   29 LVOT Area:     2.84 cm   RIGHT VENTRICLE            IVC RV S prime:     8.81 cm/s  IVC diam: 1.50 cm TAPSE (M-mode): 1.6 cm  LEFT ATRIUM              Index       RIGHT ATRIUM           Index LA diam:        3.30 cm 1.93 cm/m  RA Area:     10.10 cm LA Vol (A2C):   37.4 ml 21.85 ml/m RA Volume:   17.60 ml  10.28 ml/m LA Vol (A4C):   21.0 ml 12.27 ml/m LA Biplane Vol: 30.8 ml 18.00 ml/m AORTIC VALVE LVOT Vmax:   86.20 cm/s LVOT Vmean:  58.600 cm/s LVOT VTI:    0.175 m  AORTA Ao Root diam: 2.90 cm Ao Asc diam:  3.00 cm Ao Desc diam: 1.80 cm  MITRAL VALVE               TRICUSPID VALVE MV Area (PHT): 3.50 cm    TR Peak grad:   12.7 mmHg MV Decel Time: 217 msec    TR Vmax:        178.00 cm/s MV E velocity: 82.70 cm/s MV A velocity: 84.00 cm/s  SHUNTS MV E/A ratio:  0.98        Systemic VTI:  0.18 m Systemic Diam: 1.90 cm  Lamar Fitch MD Electronically signed by Lamar Fitch MD Signature Date/Time: 06/19/2021/12:56:39 PM    Final      CT SCANS  CT CORONARY MORPH W/CTA COR W/SCORE 06/12/2021  Addendum 06/12/2021  4:33 PM ADDENDUM REPORT: 06/12/2021 16:30  CLINICAL DATA:  51F with chest pain  EXAM: Cardiac/Coronary CTA  TECHNIQUE: The patient was scanned on a [employer].  FINDINGS: A 100 kV prospective scan was triggered in the descending thoracic aorta at 111 HU's. Axial non-contrast 3 mm slices were carried out through the heart. The data set was analyzed on a dedicated work station and scored using the Agatson method. Gantry rotation speed was 250 msecs and collimation was .6 mm. 0.8 mg of sl NTG was given. The 3D data set was reconstructed in 5% intervals of the 35-75 % of the R-R cycle. Phases were analyzed on a dedicated work station using MPR, MIP and VRT modes. The patient received 80 cc of  contrast.  Coronary Arteries:  Normal coronary origin.  Right dominance.  RCA is a large dominant artery that gives rise to PDA and PLA. There is noncalcified plaque in the proximal RCA causing 25-49% stenosis  Left main is a large artery that gives rise to LAD and LCX arteries.  LAD is a  large vessel that has no plaque.  LCX is a non-dominant artery that gives rise to one large OM1 branch. There is no plaque.  Other findings:  Left Ventricle: Normal size  Left Atrium: Normal size  Pulmonary Veins: Normal configuration  Right Ventricle: Normal size  Right Atrium: Normal size  Cardiac valves: Mild AV calcifications  Thoracic aorta: Normal size  Pulmonary Arteries: Normal size  Systemic Veins: Normal drainage  Pericardium: Normal thickness  IMPRESSION: 1. Coronary calcium score of 0.  2. Normal coronary origin with right dominance.  3. 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  CAD-RADS 2. Mild non-obstructive CAD (25-49%). Consider non-atherosclerotic causes of chest pain. Consider preventive therapy and risk factor modification.   Electronically Signed By: Lonni Nanas MD On: 06/12/2021 16:30  Narrative EXAM: OVER-READ INTERPRETATION  CT CHEST  The following report is an over-read performed by radiologist Dr. Toribio Aye of [facility] on 06/12/2021. This over-read does not include interpretation of cardiac or coronary anatomy or pathology. The coronary calcium score/coronary CTA interpretation by the cardiologist is attached.  COMPARISON:  None.  FINDINGS: Within the visualized portions of the thorax there are no suspicious appearing pulmonary nodules or masses, there is no acute consolidative airspace disease, no pleural effusions, no pneumothorax and no lymphadenopathy. Visualized portions of the upper abdomen are unremarkable. There are no aggressive appearing lytic or blastic lesions noted in the visualized portions of the skeleton.  IMPRESSION: No significant incidental noncardiac findings are noted.  Electronically Signed: By: Toribio Aye M.D. On: 06/12/2021 14:14      ______________________________________________________________________________________________      EKG Interpretation Date/Time:  Friday October 02 2024 15:13:57 EST Ventricular Rate:  69 PR Interval:  154 QRS Duration:  76 QT Interval:  390 QTC Calculation: 417 R Axis:   9  Text Interpretation: Normal sinus rhythm Normal ECG When compared with ECG of 03-Oct-2023 13:08, No significant change was found Confirmed by Monetta Rogue ([id number]) on 10/02/2024 3:20:55 PM   Recent Labs: 08/12/2024: ALT 11; BUN 14; Creatinine 1.39; Hemoglobin 11.2; Platelet Count 228; Potassium 3.3; Sodium 139; TSH <0.100  Recent Lipid Panel    Component Value Date/Time   CHOL 173 10/03/2023 1352   TRIG 181 (H) 10/03/2023 1352   HDL 69 10/03/2023 1352   CHOLHDL 2.5 10/03/2023 1352   LDLCALC 74 10/03/2023 1352    Physical Exam:    VS:  BP 126/82   Pulse 69   Ht 5' 4 (1.626 m)   Wt 135 lb 9.6 oz (61.5 kg)   SpO2 97%   BMI 23.28 kg/m     Wt Readings from Last 3 Encounters:  10/02/24 135 lb 9.6 oz (61.5 kg)  08/25/24 137 lb 14.4 oz (62.6 kg)  08/12/24 135 lb 4.8 oz (61.4 kg)     GEN:  Well nourished, well developed in no acute distress HEENT: Normal NECK: No JVD; No carotid bruits LYMPHATICS: No lymphadenopathy CARDIAC: RRR, no murmurs, rubs, gallops RESPIRATORY:  Clear to auscultation without rales, wheezing or rhonchi  ABDOMEN: Soft, non-tender, non-distended MUSCULOSKELETAL:  No edema; No deformity  SKIN: Warm and dry NEUROLOGIC:  Alert  and oriented x 3 PSYCHIATRIC:  Normal affect    Signed, Redell Leiter, MD  10/02/2024 3:22 PM    [facility]

## 2024-10-02 ENCOUNTER — Ambulatory Visit: Attending: Cardiology | Admitting: Cardiology

## 2024-10-02 VITALS — BP 126/82 | HR 69 | Ht 64.0 in | Wt 135.6 lb

## 2024-10-02 DIAGNOSIS — E271 Primary adrenocortical insufficiency: Secondary | ICD-10-CM

## 2024-10-02 DIAGNOSIS — N1832 Chronic kidney disease, stage 3b: Secondary | ICD-10-CM

## 2024-10-02 DIAGNOSIS — I951 Orthostatic hypotension: Secondary | ICD-10-CM

## 2024-10-02 DIAGNOSIS — I493 Ventricular premature depolarization: Secondary | ICD-10-CM | POA: Diagnosis not present

## 2024-10-02 DIAGNOSIS — R030 Elevated blood-pressure reading, without diagnosis of hypertension: Secondary | ICD-10-CM

## 2024-10-02 MED ORDER — PRAVASTATIN SODIUM 20 MG PO TABS
20.0000 mg | ORAL_TABLET | Freq: Every day | ORAL | 3 refills | Status: AC
Start: 2024-10-02 — End: ?

## 2024-10-02 NOTE — Patient Instructions (Signed)

## 2024-10-06 DIAGNOSIS — R1310 Dysphagia, unspecified: Secondary | ICD-10-CM | POA: Diagnosis not present

## 2024-10-14 DIAGNOSIS — I252 Old myocardial infarction: Secondary | ICD-10-CM | POA: Diagnosis not present

## 2024-10-14 DIAGNOSIS — K219 Gastro-esophageal reflux disease without esophagitis: Secondary | ICD-10-CM | POA: Diagnosis not present

## 2024-10-14 DIAGNOSIS — F419 Anxiety disorder, unspecified: Secondary | ICD-10-CM | POA: Diagnosis not present

## 2024-10-14 DIAGNOSIS — K449 Diaphragmatic hernia without obstruction or gangrene: Secondary | ICD-10-CM | POA: Diagnosis not present

## 2024-10-14 DIAGNOSIS — R1310 Dysphagia, unspecified: Secondary | ICD-10-CM | POA: Diagnosis not present

## 2024-10-14 DIAGNOSIS — G901 Familial dysautonomia [Riley-Day]: Secondary | ICD-10-CM | POA: Diagnosis not present

## 2024-10-14 DIAGNOSIS — K222 Esophageal obstruction: Secondary | ICD-10-CM | POA: Diagnosis not present

## 2024-10-14 DIAGNOSIS — D6851 Activated protein C resistance: Secondary | ICD-10-CM | POA: Diagnosis not present

## 2024-10-14 DIAGNOSIS — I2510 Atherosclerotic heart disease of native coronary artery without angina pectoris: Secondary | ICD-10-CM | POA: Diagnosis not present

## 2024-10-14 DIAGNOSIS — Z888 Allergy status to other drugs, medicaments and biological substances status: Secondary | ICD-10-CM | POA: Diagnosis not present

## 2024-10-14 DIAGNOSIS — G90A Postural orthostatic tachycardia syndrome (POTS): Secondary | ICD-10-CM | POA: Diagnosis not present

## 2024-10-14 DIAGNOSIS — Z79899 Other long term (current) drug therapy: Secondary | ICD-10-CM | POA: Diagnosis not present

## 2024-10-14 DIAGNOSIS — E039 Hypothyroidism, unspecified: Secondary | ICD-10-CM | POA: Diagnosis not present

## 2024-10-14 DIAGNOSIS — N183 Chronic kidney disease, stage 3 unspecified: Secondary | ICD-10-CM | POA: Diagnosis not present

## 2024-10-14 DIAGNOSIS — Z1211 Encounter for screening for malignant neoplasm of colon: Secondary | ICD-10-CM | POA: Diagnosis not present

## 2024-11-13 ENCOUNTER — Other Ambulatory Visit: Payer: Self-pay | Admitting: Neurology

## 2024-11-13 DIAGNOSIS — G43109 Migraine with aura, not intractable, without status migrainosus: Secondary | ICD-10-CM

## 2024-11-24 ENCOUNTER — Inpatient Hospital Stay

## 2024-11-24 ENCOUNTER — Inpatient Hospital Stay: Admitting: Hematology and Oncology

## 2024-11-30 ENCOUNTER — Inpatient Hospital Stay: Attending: Hematology and Oncology

## 2024-11-30 ENCOUNTER — Encounter: Payer: Self-pay | Admitting: Hematology and Oncology

## 2024-11-30 ENCOUNTER — Other Ambulatory Visit: Payer: Self-pay

## 2024-11-30 ENCOUNTER — Telehealth: Payer: Self-pay | Admitting: Hematology and Oncology

## 2024-11-30 ENCOUNTER — Inpatient Hospital Stay: Admitting: Hematology and Oncology

## 2024-11-30 VITALS — BP 144/82 | HR 60 | Temp 98.9°F | Resp 16 | Ht 64.0 in | Wt 139.7 lb

## 2024-11-30 DIAGNOSIS — D509 Iron deficiency anemia, unspecified: Secondary | ICD-10-CM

## 2024-11-30 DIAGNOSIS — D649 Anemia, unspecified: Secondary | ICD-10-CM | POA: Insufficient documentation

## 2024-11-30 DIAGNOSIS — D561 Beta thalassemia: Secondary | ICD-10-CM | POA: Insufficient documentation

## 2024-11-30 DIAGNOSIS — N183 Chronic kidney disease, stage 3 unspecified: Secondary | ICD-10-CM | POA: Insufficient documentation

## 2024-11-30 DIAGNOSIS — Z87891 Personal history of nicotine dependence: Secondary | ICD-10-CM | POA: Insufficient documentation

## 2024-11-30 DIAGNOSIS — E039 Hypothyroidism, unspecified: Secondary | ICD-10-CM | POA: Insufficient documentation

## 2024-11-30 LAB — CBC WITH DIFFERENTIAL (CANCER CENTER ONLY)
Abs Immature Granulocytes: 0.03 K/uL (ref 0.00–0.07)
Basophils Absolute: 0.1 K/uL (ref 0.0–0.1)
Basophils Relative: 1 %
Eosinophils Absolute: 0.2 K/uL (ref 0.0–0.5)
Eosinophils Relative: 2 %
HCT: 36.9 % (ref 36.0–46.0)
Hemoglobin: 11.7 g/dL — ABNORMAL LOW (ref 12.0–15.0)
Immature Granulocytes: 0 %
Lymphocytes Relative: 21 %
Lymphs Abs: 1.8 K/uL (ref 0.7–4.0)
MCH: 25.5 pg — ABNORMAL LOW (ref 26.0–34.0)
MCHC: 31.7 g/dL (ref 30.0–36.0)
MCV: 80.6 fL (ref 80.0–100.0)
Monocytes Absolute: 0.5 K/uL (ref 0.1–1.0)
Monocytes Relative: 6 %
Neutro Abs: 5.7 K/uL (ref 1.7–7.7)
Neutrophils Relative %: 70 %
Platelet Count: 216 K/uL (ref 150–400)
RBC: 4.58 MIL/uL (ref 3.87–5.11)
RDW: 17 % — ABNORMAL HIGH (ref 11.5–15.5)
WBC Count: 8.2 K/uL (ref 4.0–10.5)
nRBC: 0 % (ref 0.0–0.2)

## 2024-11-30 LAB — VITAMIN B12: Vitamin B-12: 839 pg/mL (ref 180–914)

## 2024-11-30 LAB — CMP (CANCER CENTER ONLY)
ALT: 9 U/L (ref 0–44)
AST: 21 U/L (ref 15–41)
Albumin: 4.5 g/dL (ref 3.5–5.0)
Alkaline Phosphatase: 50 U/L (ref 38–126)
Anion gap: 12 (ref 5–15)
BUN: 24 mg/dL — ABNORMAL HIGH (ref 8–23)
CO2: 26 mmol/L (ref 22–32)
Calcium: 10 mg/dL (ref 8.9–10.3)
Chloride: 103 mmol/L (ref 98–111)
Creatinine: 1.58 mg/dL — ABNORMAL HIGH (ref 0.44–1.00)
GFR, Estimated: 37 mL/min — ABNORMAL LOW
Glucose, Bld: 73 mg/dL (ref 70–99)
Potassium: 3.9 mmol/L (ref 3.5–5.1)
Sodium: 140 mmol/L (ref 135–145)
Total Bilirubin: 0.4 mg/dL (ref 0.0–1.2)
Total Protein: 6.7 g/dL (ref 6.5–8.1)

## 2024-11-30 LAB — IRON AND TIBC
Iron: 55 ug/dL (ref 28–170)
Saturation Ratios: 17 % (ref 10.4–31.8)
TIBC: 318 ug/dL (ref 250–450)
UIBC: 263 ug/dL

## 2024-11-30 LAB — TSH: TSH: 0.469 u[IU]/mL (ref 0.350–4.500)

## 2024-11-30 LAB — FERRITIN: Ferritin: 189 ng/mL (ref 11–307)

## 2024-11-30 LAB — FOLATE: Folate: >20 ng/mL

## 2024-11-30 NOTE — Telephone Encounter (Signed)
 Patient has been scheduled for follow-up visit per 11/30/2024 LOS.  Pt given an appt calendar with date and time.

## 2024-11-30 NOTE — Progress Notes (Signed)
 [facility] CONSULT NOTE  Patient Care Team: Allen, Chad, NP as PCP - General (Nurse Practitioner) Leigh Venetia CROME, MD as Consulting Physician (Neurology)  ASSESSMENT & PLAN:  Anemia: She has been noted to have mild anemia in the past and has been told she has thalassemia trait, but is unsure. CBC today reveals hemoglobin 11.7. All pending labs are negative for concerns of thalassemia trait. She states she has tested positive for beta thalassemia in the past, so we will send blood off for that and factor V as she states this has been in her history as well.  Iron studies are pending. She will return to clinic in 6 months for repeat evaluation.    All questions were answered. The patient knows to call the clinic with any problems, questions or concerns.  The total time spent in the appointment was 20 minutes encounter with patients including review of chart and various tests results, discussions about plan of care and coordination of care plan  Denise DELENA Bach, NP 1/5/20269:59 AM   CHIEF COMPLAINTS/PURPOSE OF CONSULTATION:  Anemia  HISTORY OF PRESENTING ILLNESS:  Denise Foley 64 y.o. female is here because of anemia  She was found to have abnormal CBC from 09/17 She does not have recent chest pain on exertion, shortness of breath on minimal exertion, pre-syncopal episodes, or palpitations. She had not noticed any recent bleeding such as epistaxis, hematuria or hematochezia The patient denies over the counter NSAID ingestion. She is not on antiplatelets agents. Her last colonoscopy was N/A She had no prior history or diagnosis of cancer. Her age appropriate screening programs are up-to-date. She denies any pica and eats a variety of diet. She never donated blood or received blood transfusion The patient was not prescribed oral iron supplements.  MEDICAL HISTORY:  Past Medical History:  Diagnosis Date   Abrasion of left cornea 06/11/2018   Addison's disease [facility])     Adrenal insufficiency 11/07/2012   Formatting of this note might be different from the original.  S/p bilateral adrenalectomy for pituitary cushing's.     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   Osteoporosis    Pott's disease     SURGICAL HISTORY: Past Surgical History:  Procedure Laterality Date   ABDOMINAL HYSTERECTOMY  1993   ADRENALECTOMY Bilateral 2002   CHOLECYSTECTOMY  2003   FRACTURE SURGERY Left 2005   ankle   PAROTIDECTOMY  1993   PVC ABLATION  2012   TONSILLECTOMY  1977   TUBAL LIGATION  1989   TUMOR EXCISION  2000   pituitary    SOCIAL HISTORY: Social History   Socioeconomic History   Marital status: Single    Spouse name: Not on file   Number of children: Not on file   Years of education: Not on file   Highest education level: Not on file  Occupational History   Not on file  Tobacco Use   Smoking status: Former    Types: Cigarettes   Smokeless tobacco: Never  Vaping Use   Vaping status: Never Used  Substance and Sexual Activity   Alcohol use: Not Currently   Drug use: Not Currently  Comment: As a teen, back in the 3s   Sexual activity: Not on file  Other Topics Concern   Not on file  Social History Narrative   Are you right handed or left handed? Right   Are you currently employed ? no   What is your current occupation?   Do you live at home alone? daughter   Who lives with you?    What type of home do you live in: 1 story or 2 story? two    Caffeine 1 cup daily   Social Drivers of Health   Tobacco Use: Medium Risk (11/17/2024)   Received from [facility]   Patient History    Smoking Tobacco Use: Former     Smokeless Tobacco Use: Never    Passive Exposure: Not on file  Financial Resource Strain: Not on file  Food Insecurity: High Risk (01/07/2023)   Received from [facility]   Epic    Within the past 12 months, you worried that your food would run out before you got money to buy more: Often true    Within the past 12 months, the food you bought just didn't last and you didn't have money to get more: Not on file  Transportation Needs: No Transportation Needs (08/12/2024)   Epic    Lack of Transportation (Medical): No    Lack of Transportation (Non-Medical): No  Physical Activity: Not on file  Stress: Not on file  Social Connections: Not on file  Intimate Partner Violence: Not At Risk (08/12/2024)   Epic    Fear of Current or Ex-Partner: No    Emotionally Abused: No    Physically Abused: No    Sexually Abused: No  Depression (PHQ2-9): Low Risk (08/12/2024)   Depression (PHQ2-9)    PHQ-2 Score: 1  Alcohol Screen: Not on file  Housing: Low Risk (08/12/2024)   Epic    Unable to Pay for Housing in the Last Year: No    Number of Times Moved in the Last Year: 0    Homeless in the Last Year: No  Utilities: Not At Risk (08/12/2024)   Epic    Threatened with loss of utilities: No  Health Literacy: Not on file    FAMILY HISTORY: Family History  Problem Relation Age of Onset   Thyroid  disease Mother    Lung cancer Mother    Liver cancer Mother    Congestive Heart Failure Father    Stroke Maternal Grandmother     ALLERGIES:  is allergic to nortriptyline  hcl, codeine, cymbalta [duloxetine hcl], depakote [divalproex sodium], duloxetine, lamictal [lamotrigine], lyrica [pregabalin], sumatriptan, tegretol [carbamazepine], toprol  xl [metoprolol ], trazodone and nefazodone, wellbutrin [bupropion], zonisamide, and tape.  MEDICATIONS:  Current Outpatient Medications  Medication Sig Dispense Refill   alendronate (FOSAMAX) 70 MG tablet Take 70 mg by mouth once a week.     aspirin EC 81 MG tablet  Take 81 mg by mouth daily. Swallow whole.     clotrimazole-betamethasone (LOTRISONE) cream Apply 1 application topically as needed (fungus).     FLUDROCORTISONE ACETATE PO Take 0.1 mg by mouth daily. Take 1/2 tablet daily     hydrocortisone (CORTEF) 5 MG tablet Take 5 mg by mouth 2 (two) times daily.     lansoprazole (PREVACID) 15 MG capsule Take 15 mg by mouth daily at 12 noon.     levothyroxine (SYNTHROID) 75 MCG tablet Take 75 mcg by mouth daily.     liothyronine (CYTOMEL) 5 MCG tablet Take 2.5 mcg by mouth  daily.     LORazepam (ATIVAN) 1 MG tablet Take 1 mg by mouth at bedtime.     Methylcobalamin (B12-ACTIVE) 1 MG CHEW Chew 1 m by mouth daily.     ondansetron  (ZOFRAN ) 4 MG tablet Take 4 mg by mouth every 8 (eight) hours as needed for nausea or vomiting.     pravastatin  (PRAVACHOL ) 20 MG tablet Take 1 tablet (20 mg total) by mouth daily. 90 tablet 3   Rimegepant Sulfate (NURTEC) 75 MG TBDP Take 1 tablet (75 mg total) by mouth as needed. (Patient taking differently: Take 75 mg by mouth as needed (Migraine).) 10 tablet 5   sertraline (ZOLOFT) 100 MG tablet Take 100 mg by mouth daily.     Vitamin D, Ergocalciferol, (DRISDOL) 1.25 MG (50000 UNIT) CAPS capsule Take 50,000 Units by mouth every 30 (thirty) minutes.     No current facility-administered medications for this visit.    REVIEW OF SYSTEMS:   Constitutional: Denies fevers, chills or abnormal night sweats Eyes: Denies blurriness of vision, double vision or watery eyes Ears, nose, mouth, throat, and face: Denies mucositis or sore throat Respiratory: Denies cough, dyspnea or wheezes Cardiovascular: Denies palpitation, chest discomfort or lower extremity swelling Gastrointestinal:  Denies nausea, heartburn or change in bowel habits Skin: Denies abnormal skin rashes Lymphatics: Denies new lymphadenopathy or easy bruising Neurological:Denies numbness, tingling or new weaknesses Behavioral/Psych: Mood is stable, no new changes  All other  systems were reviewed with the patient and are negative.  PHYSICAL EXAMINATION: ECOG PERFORMANCE STATUS: 0 - Asymptomatic  There were no vitals filed for this visit.  There were no vitals filed for this visit.   GENERAL:alert, no distress and comfortable SKIN: skin color, texture, turgor are normal, no rashes or significant lesions EYES: normal, conjunctiva are pink and non-injected, sclera clear OROPHARYNX:no exudate, no erythema and lips, buccal mucosa, and tongue normal  NECK: supple, thyroid  normal size, non-tender, without nodularity LYMPH:  no palpable lymphadenopathy in the cervical, axillary or inguinal LUNGS: clear to auscultation and percussion with normal breathing effort HEART: regular rate & rhythm and no murmurs and no lower extremity edema ABDOMEN:abdomen soft, non-tender and normal bowel sounds Musculoskeletal:no cyanosis of digits and no clubbing  PSYCH: alert & oriented x 3 with fluent speech NEURO: no focal motor/sensory deficits  RADIOGRAPHIC STUDIES: I have personally reviewed the radiological images as listed and agreed with the findings in the report. No results found. No orders of the defined types were placed in this encounter.   All questions were answered. The patient knows to call the clinic with any problems, questions or concerns.  The total time spent in the appointment was 20 minutes encounter with patients including review of chart and various tests results, discussions about plan of care and coordination of care plan  Denise DELENA Bach, NP 08/25/24

## 2024-12-04 LAB — FACTOR 5 LEIDEN

## 2024-12-11 DIAGNOSIS — D649 Anemia, unspecified: Secondary | ICD-10-CM

## 2024-12-11 LAB — MISC LABCORP TEST (SEND OUT): Labcorp test code: 252823

## 2025-04-30 ENCOUNTER — Inpatient Hospital Stay

## 2025-05-31 ENCOUNTER — Inpatient Hospital Stay

## 2025-05-31 ENCOUNTER — Inpatient Hospital Stay: Admitting: Hematology and Oncology
